# Patient Record
Sex: Female | Born: 1970 | Race: Black or African American | Hispanic: No | Marital: Single | Smoking: Current every day smoker
Health system: Southern US, Community
[De-identification: ages and names within clinical notes are randomized; demographics above are authoritative.]

## PROBLEM LIST (undated history)

## (undated) DIAGNOSIS — J45909 Unspecified asthma, uncomplicated: Secondary | ICD-10-CM

## (undated) DIAGNOSIS — D219 Benign neoplasm of connective and other soft tissue, unspecified: Secondary | ICD-10-CM

---

## 2001-12-29 HISTORY — PX: TUBAL LIGATION: SHX77

## 2004-09-14 ENCOUNTER — Emergency Department (HOSPITAL_COMMUNITY): Admission: EM | Admit: 2004-09-14 | Discharge: 2004-09-14 | Payer: Self-pay | Admitting: Emergency Medicine

## 2005-07-05 ENCOUNTER — Emergency Department (HOSPITAL_COMMUNITY): Admission: EM | Admit: 2005-07-05 | Discharge: 2005-07-05 | Payer: Self-pay | Admitting: Family Medicine

## 2005-07-24 ENCOUNTER — Ambulatory Visit: Payer: Self-pay | Admitting: Nurse Practitioner

## 2005-07-30 ENCOUNTER — Ambulatory Visit: Payer: Self-pay | Admitting: Nurse Practitioner

## 2005-11-12 ENCOUNTER — Ambulatory Visit: Payer: Self-pay | Admitting: Family Medicine

## 2006-03-02 ENCOUNTER — Emergency Department (HOSPITAL_COMMUNITY): Admission: EM | Admit: 2006-03-02 | Discharge: 2006-03-02 | Payer: Self-pay | Admitting: Family Medicine

## 2006-04-22 ENCOUNTER — Ambulatory Visit: Payer: Self-pay | Admitting: Nurse Practitioner

## 2006-04-22 ENCOUNTER — Other Ambulatory Visit: Admission: RE | Admit: 2006-04-22 | Discharge: 2006-04-22 | Payer: Self-pay | Admitting: Family Medicine

## 2006-06-12 ENCOUNTER — Ambulatory Visit: Payer: Self-pay | Admitting: Nurse Practitioner

## 2006-09-26 ENCOUNTER — Emergency Department (HOSPITAL_COMMUNITY): Admission: EM | Admit: 2006-09-26 | Discharge: 2006-09-26 | Payer: Self-pay | Admitting: Family Medicine

## 2007-04-11 ENCOUNTER — Emergency Department (HOSPITAL_COMMUNITY): Admission: EM | Admit: 2007-04-11 | Discharge: 2007-04-11 | Payer: Self-pay | Admitting: Emergency Medicine

## 2007-06-21 ENCOUNTER — Ambulatory Visit: Payer: Self-pay | Admitting: Family Medicine

## 2007-09-16 ENCOUNTER — Ambulatory Visit: Payer: Self-pay | Admitting: Internal Medicine

## 2007-10-29 ENCOUNTER — Ambulatory Visit: Payer: Self-pay | Admitting: Family Medicine

## 2008-01-07 ENCOUNTER — Ambulatory Visit: Payer: Self-pay | Admitting: Internal Medicine

## 2008-01-13 ENCOUNTER — Emergency Department (HOSPITAL_COMMUNITY): Admission: EM | Admit: 2008-01-13 | Discharge: 2008-01-13 | Payer: Self-pay | Admitting: Family Medicine

## 2008-08-08 ENCOUNTER — Emergency Department (HOSPITAL_COMMUNITY): Admission: EM | Admit: 2008-08-08 | Discharge: 2008-08-08 | Payer: Self-pay | Admitting: Family Medicine

## 2009-01-24 ENCOUNTER — Ambulatory Visit: Payer: Self-pay | Admitting: Internal Medicine

## 2009-05-20 ENCOUNTER — Emergency Department (HOSPITAL_COMMUNITY): Admission: EM | Admit: 2009-05-20 | Discharge: 2009-05-20 | Payer: Self-pay | Admitting: Emergency Medicine

## 2010-01-07 ENCOUNTER — Encounter (INDEPENDENT_AMBULATORY_CARE_PROVIDER_SITE_OTHER): Payer: Self-pay | Admitting: Family Medicine

## 2010-01-07 ENCOUNTER — Ambulatory Visit: Payer: Self-pay | Admitting: Internal Medicine

## 2010-01-07 LAB — CONVERTED CEMR LAB
ALT: 12 units/L (ref 0–35)
AST: 15 units/L (ref 0–37)
Alkaline Phosphatase: 42 units/L (ref 39–117)
BUN: 11 mg/dL (ref 6–23)
Calcium: 9.4 mg/dL (ref 8.4–10.5)
Chloride: 105 meq/L (ref 96–112)
Creatinine, Ser: 0.72 mg/dL (ref 0.40–1.20)
Saturation Ratios: 19 % — ABNORMAL LOW (ref 20–55)
TIBC: 319 ug/dL (ref 250–470)
Total Bilirubin: 0.2 mg/dL — ABNORMAL LOW (ref 0.3–1.2)
UIBC: 258 ug/dL

## 2010-01-08 ENCOUNTER — Encounter (INDEPENDENT_AMBULATORY_CARE_PROVIDER_SITE_OTHER): Payer: Self-pay | Admitting: Family Medicine

## 2010-01-08 LAB — CONVERTED CEMR LAB
Eosinophils Relative: 2 % (ref 0–5)
HCT: 37.6 % (ref 36.0–46.0)
Hemoglobin: 11.6 g/dL — ABNORMAL LOW (ref 12.0–15.0)
Lymphocytes Relative: 39 % (ref 12–46)
Lymphs Abs: 3 10*3/uL (ref 0.7–4.0)
Monocytes Absolute: 0.4 10*3/uL (ref 0.1–1.0)
Monocytes Relative: 5 % (ref 3–12)
Platelets: 305 10*3/uL (ref 150–400)
RBC: 3.82 M/uL — ABNORMAL LOW (ref 3.87–5.11)
WBC: 7.8 10*3/uL (ref 4.0–10.5)

## 2010-01-24 ENCOUNTER — Ambulatory Visit (HOSPITAL_COMMUNITY): Admission: RE | Admit: 2010-01-24 | Discharge: 2010-01-24 | Payer: Self-pay | Admitting: Family Medicine

## 2010-01-24 ENCOUNTER — Encounter (INDEPENDENT_AMBULATORY_CARE_PROVIDER_SITE_OTHER): Payer: Self-pay | Admitting: Family Medicine

## 2011-05-19 ENCOUNTER — Encounter: Payer: Self-pay | Admitting: Obstetrics & Gynecology

## 2011-09-18 LAB — I-STAT 8, (EC8 V) (CONVERTED LAB)
BUN: 12
Chloride: 107
Glucose, Bld: 85
Hemoglobin: 14.3
Potassium: 4
Sodium: 136
pH, Ven: 7.41 — ABNORMAL HIGH

## 2011-09-18 LAB — DIFFERENTIAL
Eosinophils Absolute: 0.1
Lymphocytes Relative: 14
Lymphs Abs: 2.1
Monocytes Relative: 5
Neutro Abs: 12 — ABNORMAL HIGH
Neutrophils Relative %: 80 — ABNORMAL HIGH

## 2011-09-18 LAB — POCT URINALYSIS DIP (DEVICE)
Bilirubin Urine: NEGATIVE
Glucose, UA: NEGATIVE
Nitrite: NEGATIVE
Operator id: 126491

## 2011-09-18 LAB — POCT I-STAT CREATININE: Operator id: 126491

## 2011-09-18 LAB — CBC
HCT: 37
Hemoglobin: 12.6
MCV: 90.6
Platelets: 321
RDW: 13.3

## 2012-09-14 ENCOUNTER — Encounter (HOSPITAL_COMMUNITY): Payer: Self-pay | Admitting: *Deleted

## 2012-09-14 ENCOUNTER — Emergency Department (HOSPITAL_COMMUNITY)
Admission: EM | Admit: 2012-09-14 | Discharge: 2012-09-14 | Disposition: A | Discharge status: Home / Self Care | Payer: Medicaid Other | Source: Home / Self Care | Attending: Family Medicine | Admitting: Family Medicine

## 2012-09-14 DIAGNOSIS — M1711 Unilateral primary osteoarthritis, right knee: Secondary | ICD-10-CM

## 2012-09-14 DIAGNOSIS — N946 Dysmenorrhea, unspecified: Secondary | ICD-10-CM

## 2012-09-14 DIAGNOSIS — N945 Secondary dysmenorrhea: Secondary | ICD-10-CM

## 2012-09-14 HISTORY — DX: Unspecified asthma, uncomplicated: J45.909

## 2012-09-14 HISTORY — DX: Benign neoplasm of connective and other soft tissue, unspecified: D21.9

## 2012-09-14 MED ORDER — KETOROLAC TROMETHAMINE 30 MG/ML IJ SOLN
30.0000 mg | Freq: Once | INTRAMUSCULAR | Status: AC
  Administered 2012-09-14: 30 mg via INTRAMUSCULAR

## 2012-09-14 MED ORDER — KETOROLAC TROMETHAMINE 60 MG/2ML IM SOLN
INTRAMUSCULAR | Status: AC
  Filled 2012-09-14: qty 2

## 2012-09-14 MED ORDER — DICLOFENAC POTASSIUM 50 MG PO TABS: 50.0000 mg | ORAL_TABLET | Freq: Three times a day (TID) | ORAL | Status: DC
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
## 2012-09-14 NOTE — ED Provider Notes (Signed)
History     CSN: [id number]  Arrival date & time 09/14/12  1242   First MD Initiated Contact with Patient 09/14/12 1247      Chief Complaint  Patient presents with  . Abdominal Pain    (Consider location/radiation/quality/duration/timing/severity/associated sxs/prior treatment) Patient is a 41 y.o. female presenting with female genitourinary complaint. The history is provided by the patient.  Female GU Problem Primary symptoms include pelvic pain and vaginal bleeding.  Primary symptoms include no dysuria. There has been no fever. This is a new problem. The current episode started yesterday. The problem has not changed since onset.The symptoms occur during menstruation (2 yr h/o fibroids, cramps and heavy menses.). She is not pregnant. She has not missed her period. The patient's menstrual history has been regular. The discharge was bloody. Pertinent negatives include no frequency.    Past Medical History  Diagnosis Date  . Asthma   . Fibroid     History reviewed. No pertinent past surgical history.  No family history on file.  History  Substance Use Topics  . Smoking status: Current Every Day Smoker  . Smokeless tobacco: Not on file  . Alcohol Use: No    OB History    Grav Para Term Preterm Abortions TAB SAB Ect Mult Living                  Review of Systems  Constitutional: Negative.   Genitourinary: Positive for vaginal bleeding, menstrual problem and pelvic pain. Negative for dysuria, frequency, vaginal discharge and difficulty urinating.    Allergies  Review of patient's allergies indicates no known allergies.  Home Medications   Current Outpatient Rx  Name Route Sig Dispense Refill  . ALBUTEROL SULFATE HFA 108 (90 BASE) MCG/ACT IN AERS Inhalation Inhale 2 puffs into the lungs every 6 (six) hours as needed.    Marland Kitchen DICLOFENAC POTASSIUM 50 MG PO TABS Oral Take 1 tablet (50 mg total) by mouth 3 (three) times daily. 30 tablet 0    BP 129/88  Pulse 55  Temp  98 F (36.7 C) (Oral)  Resp 17  SpO2 100%  LMP 09/14/2012  Physical Exam  Nursing note and vitals reviewed. Abdominal: Soft. Bowel sounds are normal. She exhibits no distension and no mass. There is tenderness in the suprapubic area. There is no rebound and no guarding.    Musculoskeletal: She exhibits no tenderness.       Right knee: She exhibits normal range of motion, no swelling, no effusion and no deformity. no tenderness found.    ED Course  Procedures (including critical care time)  Labs Reviewed - No data to display No results found.   1. Secondary dysmenorrhea   2. Degenerative joint disease of knee, right       MDM          Linna Hoff, MD 09/14/12 1404

## 2013-05-12 ENCOUNTER — Other Ambulatory Visit: Payer: Self-pay

## 2013-05-12 DIAGNOSIS — Z1231 Encounter for screening mammogram for malignant neoplasm of breast: Secondary | ICD-10-CM

## 2013-05-25 ENCOUNTER — Encounter: Payer: Self-pay | Admitting: Obstetrics

## 2013-06-14 ENCOUNTER — Ambulatory Visit
Admission: RE | Admit: 2013-06-14 | Discharge: 2013-06-14 | Disposition: A | Discharge status: Home / Self Care | Payer: Medicaid Other | Source: Ambulatory Visit

## 2013-06-14 DIAGNOSIS — Z1231 Encounter for screening mammogram for malignant neoplasm of breast: Secondary | ICD-10-CM

## 2013-07-19 ENCOUNTER — Ambulatory Visit: Payer: Self-pay | Admitting: Obstetrics

## 2014-11-12 ENCOUNTER — Encounter (HOSPITAL_COMMUNITY): Payer: Self-pay | Admitting: *Deleted

## 2014-11-12 ENCOUNTER — Emergency Department (INDEPENDENT_AMBULATORY_CARE_PROVIDER_SITE_OTHER)
Admission: EM | Admit: 2014-11-12 | Discharge: 2014-11-12 | Disposition: A | Discharge status: Home / Self Care | Payer: Self-pay | Source: Home / Self Care | Attending: Emergency Medicine | Admitting: Emergency Medicine

## 2014-11-12 DIAGNOSIS — W57XXXA Bitten or stung by nonvenomous insect and other nonvenomous arthropods, initial encounter: Secondary | ICD-10-CM

## 2014-11-12 DIAGNOSIS — T148 Other injury of unspecified body region: Secondary | ICD-10-CM

## 2014-11-12 MED ORDER — MUPIROCIN CALCIUM 2 % EX CREA: 1.0000 "application " | TOPICAL_CREAM | Freq: Two times a day (BID) | CUTANEOUS | Status: DC

## 2014-11-12 NOTE — ED Provider Notes (Signed)
CSN: [id number]     Arrival date & time 11/12/14  1058 History   First MD Initiated Contact with Patient 11/12/14 1101     Chief Complaint  Patient presents with  . Insect Bite   (Consider location/radiation/quality/duration/timing/severity/associated sxs/prior Treatment) HPI  She is a 43 year old woman here for evaluation of bug bites. She states yesterday morning she woke up with 3 bites on her left upper arm. They are very itchy. She did not see the insect that bit her. She has been applying Neosporin without improvement.  Past Medical History  Diagnosis Date  . Asthma   . Fibroid    Past Surgical History  Procedure Laterality Date  . Tubal ligation  2003   Family History  Problem Relation Age of Onset  . Cancer Father     lung  . Crohn's disease Father    History  Substance Use Topics  . Smoking status: Current Every Day Smoker -- 0.50 packs/day    Types: Cigarettes  . Smokeless tobacco: Not on file  . Alcohol Use: No   OB History    No data available     Review of Systems  Skin: Positive for rash.    Allergies  Amoxicillin  Home Medications   Prior to Admission medications   Medication Sig Start Date End Date Taking? Authorizing Provider  albuterol (PROVENTIL HFA;VENTOLIN HFA) 108 (90 BASE) MCG/ACT inhaler Inhale 2 puffs into the lungs every 6 (six) hours as needed.   Yes Historical Provider, MD  diclofenac (CATAFLAM) 50 MG tablet Take 1 tablet (50 mg total) by mouth 3 (three) times daily. 09/14/12   Billy Fischer, MD  mupirocin cream (BACTROBAN) 2 % Apply 1 application topically 2 (two) times daily. 11/12/14   Melony Overly, MD   BP 118/79 mmHg  Pulse 60  Temp(Src) 98.3 F (36.8 C) (Oral)  Resp 16  SpO2 100%  LMP 11/06/2014 Physical Exam  Constitutional: She is oriented to person, place, and time. She appears well-developed and well-nourished. No distress.  Cardiovascular: Normal rate.   Pulmonary/Chest: Effort normal.  Neurological: She is alert  and oriented to person, place, and time.  Skin:  3 erythematous wheals on left upper arm. Each one is 3 cm in diameter.    ED Course  Procedures (including critical care time) Labs Review Labs Reviewed - No data to display  Imaging Review No results found.   MDM   1. Bug bites    Unknown insect bite. They do not appear to be infected. We'll treat symptomatically with over-the-counter Benadryl and hydrocortisone creams. Okay to take oral Benadryl as well for itching. We'll have her apply mupirocin cream twice a day for 2 days. Follow-up as needed.    Melony Overly, MD 11/12/14 1150

## 2014-11-12 NOTE — ED Notes (Signed)
Insect bites to L upper arm.  Did not see any spiders in her bedroom.  Woke up yesterday AM with 3 bites to L upper arm with itching.

## 2014-11-12 NOTE — Discharge Instructions (Signed)
You have some sort of bug bite on your arm. Apply the Bactroban cream twice a day for 2 days. Use over the counter benadryl cream or hydrocortisone cream to help with the itching and redness. You can take oral benadryl as needed to help with itching as well.  Follow up as needed.

## 2016-07-14 ENCOUNTER — Ambulatory Visit (HOSPITAL_COMMUNITY)
Admission: EM | Admit: 2016-07-14 | Discharge: 2016-07-14 | Disposition: A | Discharge status: Home / Self Care | Payer: Self-pay | Attending: Family Medicine | Admitting: Family Medicine

## 2016-07-14 ENCOUNTER — Encounter (HOSPITAL_COMMUNITY): Payer: Self-pay | Admitting: Emergency Medicine

## 2016-07-14 DIAGNOSIS — M722 Plantar fascial fibromatosis: Secondary | ICD-10-CM

## 2016-07-14 NOTE — ED Provider Notes (Signed)
CSN: [id number]     Arrival date & time 07/14/16  1238 History   First MD Initiated Contact with Patient 07/14/16 1434     Chief Complaint  Patient presents with  . Foot Pain   (Consider location/radiation/quality/duration/timing/severity/associated sxs/prior Treatment) HPI Comments: Procedure 45-year-old female developed pain to the left heel 3 days ago. She denies any trauma. She did not twist her ankle, fall or injure her ankle or foot in any way.  her job requires prolonged standing for several hours during the day. Weightbearing and putting weight on on her foot increases the pain to the plantar aspect of the heel. Pain is worse with first foot plantar of the day after awakening.   Patient is a 45 y.o. female presenting with lower extremity pain.  Foot Pain    Past Medical History  Diagnosis Date  . Asthma   . Fibroid    Past Surgical History  Procedure Laterality Date  . Tubal ligation  2003   Family History  Problem Relation Age of Onset  . Cancer Father     lung  . Crohn's disease Father    Social History  Substance Use Topics  . Smoking status: Current Every Day Smoker -- 0.50 packs/day    Types: Cigarettes  . Smokeless tobacco: None  . Alcohol Use: No   OB History    No data available     Review of Systems  Constitutional: Negative.  Negative for fever, chills and activity change.  HENT: Negative.   Respiratory: Negative.   Musculoskeletal:       As per HPI  Skin: Negative for color change, pallor and rash.  Neurological: Negative.     Allergies  Amoxicillin  Home Medications   Prior to Admission medications   Medication Sig Start Date End Date Taking? Authorizing Provider  albuterol (PROVENTIL HFA;VENTOLIN HFA) 108 (90 BASE) MCG/ACT inhaler Inhale 2 puffs into the lungs every 6 (six) hours as needed.    Historical Provider, MD  diclofenac (CATAFLAM) 50 MG tablet Take 1 tablet (50 mg total) by mouth 3 (three) times daily. 09/14/12   Billy Fischer, MD   mupirocin cream (BACTROBAN) 2 % Apply 1 application topically 2 (two) times daily. 11/12/14   Melony Overly, MD   Meds Ordered and Administered this Visit  Medications - No data to display  BP 126/75 mmHg  Pulse 60  Temp(Src) 98.3 F (36.8 C) (Oral)  Resp 12  SpO2 100%  LMP 06/30/2016 No data found.   Physical Exam  Constitutional: She appears well-developed and well-nourished.  HENT:  Head: Normocephalic and atraumatic.  Eyes: Conjunctivae and EOM are normal.  Neck: Normal range of motion.  Cardiovascular: Normal rate.   Pulmonary/Chest: Effort normal.  Musculoskeletal:  Foot without discoloration, deformity, swelling or apparent injury. No bony tenderness. There is marked tenderness to the plantar aspect of the heel. No injury to the Skin. No evidence of infection. No swelling or erythema or cellulitis.  Neurological: She is alert.  Skin: Skin is warm and dry.  Nursing note and vitals reviewed.   ED Course  Procedures (including critical care time)  Labs Review Labs Reviewed - No data to display  Imaging Review No results found.   Visual Acuity Review  Right Eye Distance:   Left Eye Distance:   Bilateral Distance:    Right Eye Near:   Left Eye Near:    Bilateral Near:         MDM   1. Plantar  fasciitis of left foot    Roll your heel over a frozen can several times a day for inflammation. Obtained full-length inserts with high arch support to place in your shoes. If you are not gradually getting better with these measures he may need to have injections into your heel or follow-up with a podiatrist.     Janne Napoleon, NP 07/14/16 1553

## 2016-07-14 NOTE — Discharge Instructions (Signed)
Plantar Fasciitis Roll your heel over a frozen can several times a day for inflammation. Obtained full-length inserts with high arch support to place in your shoes. If you are not gradually getting better with these measures he may need to have injections into your heel or follow-up with a podiatrist. Plantar fasciitis is a painful foot condition that affects the heel. It occurs when the band of tissue that connects the toes to the heel bone (plantar fascia) becomes irritated. This can happen after exercising too much or doing other repetitive activities (overuse injury). The pain from plantar fasciitis can range from mild irritation to severe pain that makes it difficult for you to walk or move. The pain is usually worse in the morning or after you have been sitting or lying down for a while. CAUSES This condition may be caused by:  Standing for long periods of time.  Wearing shoes that do not fit.  Doing high-impact activities, including running, aerobics, and ballet.  Being overweight.  Having an abnormal way of walking (gait).  Having tight calf muscles.  Having high arches in your feet.  Starting a new athletic activity. SYMPTOMS The main symptom of this condition is heel pain. Other symptoms include:  Pain that gets worse after activity or exercise.  Pain that is worse in the morning or after resting.  Pain that goes away after you walk for a few minutes. DIAGNOSIS This condition may be diagnosed based on your signs and symptoms. Your health care provider will also do a physical exam to check for:  A tender area on the bottom of your foot.  A high arch in your foot.  Pain when you move your foot.  Difficulty moving your foot. You may also need to have imaging studies to confirm the diagnosis. These can include:  X-rays.  Ultrasound.  MRI. TREATMENT  Treatment for plantar fasciitis depends on the severity of the condition. Your treatment may include:  Rest,  ice, and over-the-counter pain medicines to manage your pain.  Exercises to stretch your calves and your plantar fascia.  A splint that holds your foot in a stretched, upward position while you sleep (night splint).  Physical therapy to relieve symptoms and prevent problems in the future.  Cortisone injections to relieve severe pain.  Extracorporeal shock wave therapy (ESWT) to stimulate damaged plantar fascia with electrical impulses. It is often used as a last resort before surgery.  Surgery, if other treatments have not worked after 12 months. HOME CARE INSTRUCTIONS  Take medicines only as directed by your health care provider.  Avoid activities that cause pain.  Roll the bottom of your foot over a bag of ice or a bottle of cold water. Do this for 20 minutes, 3-4 times a day.  Perform simple stretches as directed by your health care provider.  Try wearing athletic shoes with air-sole or gel-sole cushions or soft shoe inserts.  Wear a night splint while sleeping, if directed by your health care provider.  Keep all follow-up appointments with your health care provider. PREVENTION   Do not perform exercises or activities that cause heel pain.  Consider finding low-impact activities if you continue to have problems.  Lose weight if you need to. The best way to prevent plantar fasciitis is to avoid the activities that aggravate your plantar fascia. SEEK MEDICAL CARE IF:  Your symptoms do not go away after treatment with home care measures.  Your pain gets worse.  Your pain affects your ability to  move or do your daily activities.   This information is not intended to replace advice given to you by your health care provider. Make sure you discuss any questions you have with your health care provider.   Document Released: 09/09/2001 Document Revised: 09/05/2015 Document Reviewed: 10/25/2014 Elsevier Interactive Patient Education [employer].

## 2016-07-14 NOTE — ED Notes (Signed)
Pt c/o left heel pain onset x3 days that radiates to ankle Reports pain increases w/long periods of walking/standing Steady gait.... A&O x4... NAD

## 2016-08-24 ENCOUNTER — Emergency Department (HOSPITAL_COMMUNITY)
Admission: EM | Admit: 2016-08-24 | Discharge: 2016-08-24 | Disposition: A | Discharge status: Home / Self Care | Payer: Medicaid Other | Attending: Emergency Medicine | Admitting: Emergency Medicine

## 2016-08-24 ENCOUNTER — Emergency Department (HOSPITAL_COMMUNITY): Payer: Medicaid Other

## 2016-08-24 ENCOUNTER — Encounter (HOSPITAL_COMMUNITY): Payer: Self-pay | Admitting: Emergency Medicine

## 2016-08-24 ENCOUNTER — Ambulatory Visit (HOSPITAL_COMMUNITY)
Admission: EM | Admit: 2016-08-24 | Discharge: 2016-08-24 | Disposition: A | Discharge status: Nursing Home (Includes ICF) | Payer: Medicaid Other | Attending: Nurse Practitioner | Admitting: Nurse Practitioner

## 2016-08-24 DIAGNOSIS — J45909 Unspecified asthma, uncomplicated: Secondary | ICD-10-CM | POA: Insufficient documentation

## 2016-08-24 DIAGNOSIS — M542 Cervicalgia: Secondary | ICD-10-CM

## 2016-08-24 DIAGNOSIS — Y92410 Unspecified street and highway as the place of occurrence of the external cause: Secondary | ICD-10-CM | POA: Insufficient documentation

## 2016-08-24 DIAGNOSIS — Y999 Unspecified external cause status: Secondary | ICD-10-CM | POA: Diagnosis not present

## 2016-08-24 DIAGNOSIS — R519 Headache, unspecified: Secondary | ICD-10-CM

## 2016-08-24 DIAGNOSIS — R51 Headache: Secondary | ICD-10-CM | POA: Insufficient documentation

## 2016-08-24 DIAGNOSIS — M546 Pain in thoracic spine: Secondary | ICD-10-CM | POA: Insufficient documentation

## 2016-08-24 DIAGNOSIS — Y939 Activity, unspecified: Secondary | ICD-10-CM | POA: Insufficient documentation

## 2016-08-24 DIAGNOSIS — F17210 Nicotine dependence, cigarettes, uncomplicated: Secondary | ICD-10-CM | POA: Diagnosis not present

## 2016-08-24 MED ORDER — METHOCARBAMOL 500 MG PO TABS: 500.0000 mg | ORAL_TABLET | Freq: Two times a day (BID) | ORAL | 0 refills | Status: DC | PRN

## 2016-08-24 MED ORDER — DIAZEPAM 2 MG PO TABS
2.0000 mg | ORAL_TABLET | Freq: Once | ORAL | Status: AC
  Administered 2016-08-24: 2 mg via ORAL
  Filled 2016-08-24: qty 1

## 2016-08-24 MED ORDER — OXYCODONE-ACETAMINOPHEN 5-325 MG PO TABS
ORAL_TABLET | ORAL | Status: AC
  Administered 2016-08-24: 1 via ORAL
  Filled 2016-08-24: qty 1

## 2016-08-24 MED ORDER — OXYCODONE-ACETAMINOPHEN 5-325 MG PO TABS
1.0000 | ORAL_TABLET | ORAL | Status: AC | PRN
  Administered 2016-08-24 (×2): 1 via ORAL
  Filled 2016-08-24: qty 1

## 2016-08-24 MED ORDER — IBUPROFEN 800 MG PO TABS
800.0000 mg | ORAL_TABLET | Freq: Once | ORAL | Status: AC
  Administered 2016-08-24: 800 mg via ORAL
  Filled 2016-08-24: qty 1

## 2016-08-24 MED ORDER — NAPROXEN 250 MG PO TABS: 250.0000 mg | ORAL_TABLET | Freq: Two times a day (BID) | ORAL | 0 refills | Status: DC
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
## 2016-08-24 NOTE — ED Notes (Signed)
Reports no headache at this time.
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
## 2016-08-24 NOTE — ED Notes (Signed)
Patient report called to First RN Angela Nevin at [facility] ED.

## 2017-01-29 ENCOUNTER — Encounter (HOSPITAL_COMMUNITY): Payer: Self-pay | Admitting: Emergency Medicine

## 2017-01-29 ENCOUNTER — Ambulatory Visit (HOSPITAL_COMMUNITY)
Admission: EM | Admit: 2017-01-29 | Discharge: 2017-01-29 | Disposition: A | Discharge status: Home / Self Care | Payer: Medicaid Other | Attending: Family Medicine | Admitting: Family Medicine

## 2017-01-29 DIAGNOSIS — L0231 Cutaneous abscess of buttock: Secondary | ICD-10-CM | POA: Diagnosis not present

## 2017-01-29 MED ORDER — HYDROCODONE-ACETAMINOPHEN 5-325 MG PO TABS: 1.0000 | ORAL_TABLET | ORAL | 0 refills | Status: DC | PRN

## 2017-01-29 MED ORDER — SULFAMETHOXAZOLE-TRIMETHOPRIM 800-160 MG PO TABS: 1.0000 | ORAL_TABLET | Freq: Two times a day (BID) | ORAL | 0 refills | Status: AC

## 2017-01-29 NOTE — ED Triage Notes (Signed)
Here for poss pilonidal cyst onset 2 week  Unable to sit or walk due to pain  Denies fevers... A&O x4.. NAD

## 2017-01-29 NOTE — ED Provider Notes (Signed)
CSN: [id number]     Arrival date & time 01/29/17  1740 History   None    Chief Complaint  Patient presents with  . Abscess   (Consider location/radiation/quality/duration/timing/severity/associated sxs/prior Treatment) Patient has cyst on left upper gluteal cleft and it is painful.  She has had this wound for over 2 weeks.   The history is provided by the patient.  Abscess  Abscess location: left buttock. Abscess quality: fluctuance, painful and redness   Red streaking: no   Duration:  2 weeks Progression:  Worsening Pain details:    Quality:  Pressure and aching   Severity:  Moderate   Duration:  2 weeks   Timing:  Constant   Progression:  Worsening Chronicity:  New Relieved by:  Nothing Worsened by:  Nothing Ineffective treatments:  None tried   Past Medical History:  Diagnosis Date  . Asthma   . Fibroid    Past Surgical History:  Procedure Laterality Date  . TUBAL LIGATION  2003   Family History  Problem Relation Age of Onset  . Cancer Father     lung  . Crohn's disease Father    Social History  Substance Use Topics  . Smoking status: Current Every Day Smoker    Packs/day: 0.50    Years: 10.00    Types: Cigarettes  . Smokeless tobacco: Never Used  . Alcohol use No   OB History    No data available     Review of Systems  Constitutional: Negative.   HENT: Negative.   Eyes: Negative.   Respiratory: Negative.   Cardiovascular: Negative.   Gastrointestinal: Negative.   Endocrine: Negative.   Genitourinary: Negative.   Musculoskeletal: Negative.   Skin: Positive for wound.  Allergic/Immunologic: Negative.   Neurological: Negative.   Hematological: Negative.   Psychiatric/Behavioral: Negative.     Allergies  Amoxicillin  Home Medications   Prior to Admission medications   Medication Sig Start Date End Date Taking? Authorizing Provider  albuterol (PROVENTIL HFA;VENTOLIN HFA) 108 (90 BASE) MCG/ACT inhaler Inhale 2 puffs into the lungs every 6  (six) hours as needed.    Historical Provider, MD  diclofenac (CATAFLAM) 50 MG tablet Take 1 tablet (50 mg total) by mouth 3 (three) times daily. 09/14/12   Billy Fischer, MD  HYDROcodone-acetaminophen (NORCO/VICODIN) 5-325 MG tablet Take 1-2 tablets by mouth every 4 (four) hours as needed. 01/29/17   Lysbeth Penner, FNP  methocarbamol (ROBAXIN) 500 MG tablet Take 1 tablet (500 mg total) by mouth 2 (two) times daily as needed for muscle spasms. 08/24/16   Waynetta Pean, PA-C  mupirocin cream (BACTROBAN) 2 % Apply 1 application topically 2 (two) times daily. 11/12/14   Melony Overly, MD  naproxen (NAPROSYN) 250 MG tablet Take 1 tablet (250 mg total) by mouth 2 (two) times daily with a meal. 08/24/16   Waynetta Pean, PA-C  sulfamethoxazole-trimethoprim (BACTRIM DS,SEPTRA DS) 800-160 MG tablet Take 1 tablet by mouth 2 (two) times daily. 01/29/17 02/05/17  Lysbeth Penner, FNP   Meds Ordered and Administered this Visit  Medications - No data to display  BP 114/77 (BP Location: Left Arm)   Pulse 76   Temp 98.3 F (36.8 C) (Oral)   Resp 20   LMP 01/17/2017   SpO2 100%  No data found.   Physical Exam  Constitutional: She appears well-developed and well-nourished.  HENT:  Head: Normocephalic and atraumatic.  Eyes: Conjunctivae and EOM are normal. Pupils are equal, round, and reactive to light.  Neck: Normal range of motion. Neck supple.  Cardiovascular: Normal rate, regular rhythm and normal heart sounds.   Pulmonary/Chest: Effort normal and breath sounds normal.  Skin:  Abscess left upper gluteal fold   Nursing note and vitals reviewed.   Urgent Care Course     .Marland KitchenIncision and Drainage Date/Time: 01/29/2017 7:01 PM Performed by: Lysbeth Penner Authorized by: Robyn Haber   Consent:    Consent obtained:  Verbal   Consent given by:  Patient   Risks discussed:  Bleeding   Alternatives discussed:  No treatment Location:    Type:  Abscess   Size:  3 cm   Location: left upper  gluteal cleft. Pre-procedure details:    Skin preparation:  Betadine Anesthesia (see MAR for exact dosages):    Anesthesia method:  Local infiltration   Local anesthetic:  Lidocaine 1% w/o epi Procedure type:    Complexity:  Simple Procedure details:    Needle aspiration: no     Incision types:  Stab incision   Incision depth:  Dermal   Scalpel blade:  11   Wound management:  Probed and deloculated   Drainage:  Purulent, serosanguinous and bloody   Drainage amount:  Moderate   Wound treatment:  Wound left open   Packing materials:  1/2 in iodoform gauze   Amount 1/2" iodoform:  4 cm Post-procedure details:    Patient tolerance of procedure:  Tolerated well, no immediate complications   (including critical care time)  Labs Review Labs Reviewed  AEROBIC/ANAEROBIC CULTURE (SURGICAL/DEEP WOUND)    Imaging Review No results found.   Visual Acuity Review  Right Eye Distance:   Left Eye Distance:   Bilateral Distance:    Right Eye Near:   Left Eye Near:    Bilateral Near:         MDM   1. Abscess of left buttock    Incision and Drainage  Bactrim DS one po bid x 10 days #20 Norco 5/325 one to two po q 6 hours prn pain #6  Advised to remove packing out in 2 days.  May follow up here on 2 days for wound check.      Lysbeth Penner, FNP 01/29/17 [phone]

## 2017-01-29 NOTE — ED Notes (Signed)
At bedside as chaperone, specimen was obtained by provider.

## 2017-02-02 ENCOUNTER — Ambulatory Visit (HOSPITAL_COMMUNITY)
Admission: EM | Admit: 2017-02-02 | Discharge: 2017-02-02 | Disposition: A | Discharge status: Home / Self Care | Payer: Self-pay | Attending: Internal Medicine | Admitting: Internal Medicine

## 2017-02-02 DIAGNOSIS — L0291 Cutaneous abscess, unspecified: Secondary | ICD-10-CM

## 2017-02-02 NOTE — ED Notes (Signed)
At bedside for bill oxford, np (provider) examination of wound on buttocks.  Packing fell out while going to bathroom this morning.  Site unremarkable

## 2017-02-02 NOTE — Discharge Instructions (Signed)
Keep band aid over wound.  Wound no longer needs packing. Continue antibiotics and follow up as necessary.

## 2017-02-02 NOTE — ED Provider Notes (Signed)
CSN: [id number]     Arrival date & time 02/02/17  1145 History   First MD Initiated Contact with Patient 02/02/17 1420     Chief Complaint  Patient presents with  . Abscess  . packaging removal    (Consider location/radiation/quality/duration/timing/severity/associated sxs/prior Treatment) Patient is here for follow up on abscess left upper buttock.  She is not sure if the packing is still there but she is feeling a lot better.   The history is provided by the patient.  Abscess  Abscess location: buttock. Abscess quality: draining   Red streaking: no   Duration:  4 days Progression:  Improving Chronicity:  New Relieved by:  Oral antibiotics and draining/squeezing Worsened by:  Nothing Ineffective treatments:  Oral antibiotics and draining/squeezing   Past Medical History:  Diagnosis Date  . Asthma   . Fibroid    Past Surgical History:  Procedure Laterality Date  . TUBAL LIGATION  2003   Family History  Problem Relation Age of Onset  . Cancer Father     lung  . Crohn's disease Father    Social History  Substance Use Topics  . Smoking status: Current Every Day Smoker    Packs/day: 0.50    Years: 10.00    Types: Cigarettes  . Smokeless tobacco: Never Used  . Alcohol use No   OB History    No data available     Review of Systems  Constitutional: Negative.   HENT: Negative.   Eyes: Negative.   Respiratory: Negative.   Cardiovascular: Negative.   Gastrointestinal: Negative.   Endocrine: Negative.   Genitourinary: Negative.   Musculoskeletal: Negative.   Allergic/Immunologic: Negative.   Hematological: Negative.   Psychiatric/Behavioral: Negative.     Allergies  Amoxicillin  Home Medications   Prior to Admission medications   Medication Sig Start Date End Date Taking? Authorizing Provider  albuterol (PROVENTIL HFA;VENTOLIN HFA) 108 (90 BASE) MCG/ACT inhaler Inhale 2 puffs into the lungs every 6 (six) hours as needed.   Yes Historical Provider, MD   diclofenac (CATAFLAM) 50 MG tablet Take 1 tablet (50 mg total) by mouth 3 (three) times daily. 09/14/12  Yes Billy Fischer, MD  HYDROcodone-acetaminophen (NORCO/VICODIN) 5-325 MG tablet Take 1-2 tablets by mouth every 4 (four) hours as needed. 01/29/17  Yes Lysbeth Penner, FNP  methocarbamol (ROBAXIN) 500 MG tablet Take 1 tablet (500 mg total) by mouth 2 (two) times daily as needed for muscle spasms. 08/24/16  Yes Waynetta Pean, PA-C  mupirocin cream (BACTROBAN) 2 % Apply 1 application topically 2 (two) times daily. 11/12/14  Yes Melony Overly, MD  naproxen (NAPROSYN) 250 MG tablet Take 1 tablet (250 mg total) by mouth 2 (two) times daily with a meal. 08/24/16  Yes Waynetta Pean, PA-C  sulfamethoxazole-trimethoprim (BACTRIM DS,SEPTRA DS) 800-160 MG tablet Take 1 tablet by mouth 2 (two) times daily. 01/29/17 02/05/17 Yes Lysbeth Penner, FNP   Meds Ordered and Administered this Visit  Medications - No data to display  BP 100/64 (BP Location: Left Arm)   Pulse (!) 59   Temp 98.1 F (36.7 C) (Oral)   Resp 16   LMP 01/17/2017   SpO2 100%  No data found.   Physical Exam  Constitutional: She appears well-developed and well-nourished.  HENT:  Head: Normocephalic and atraumatic.  Right Ear: External ear normal.  Left Ear: External ear normal.  Mouth/Throat: Oropharynx is clear and moist.  Eyes: Conjunctivae and EOM are normal. Pupils are equal, round, and reactive to  light.  Neck: Normal range of motion. Neck supple.  Cardiovascular: Normal rate, regular rhythm and normal heart sounds.   Pulmonary/Chest: Effort normal and breath sounds normal.  Skin:  Left upper buttock with wound with no packing and s/p incision and drainage is healing well and no induration or fluctuance appreciated.  Nursing note and vitals reviewed.   Urgent Care Course     Procedures (including critical care time)  Labs Review Labs Reviewed - No data to display  Imaging Review No results found.   Visual  Acuity Review  Right Eye Distance:   Left Eye Distance:   Bilateral Distance:    Right Eye Near:   Left Eye Near:    Bilateral Near:         MDM   1. Abscess    Wound is healing well and recommend continue abx's. Placed 2x2 dressing and taped secure.  Keep bandaid over wound.  No need for packing or repacking at this time.  Please follow up prn and return precautions explained.      Lysbeth Penner, FNP 02/02/17 1425

## 2017-02-02 NOTE — ED Triage Notes (Signed)
Here for removal of packing in abscess  States area is still sensitive

## 2017-02-03 LAB — AEROBIC/ANAEROBIC CULTURE W GRAM STAIN (SURGICAL/DEEP WOUND)

## 2017-02-03 LAB — AEROBIC/ANAEROBIC CULTURE (SURGICAL/DEEP WOUND)

## 2017-07-22 ENCOUNTER — Encounter (HOSPITAL_COMMUNITY): Payer: Self-pay | Admitting: Emergency Medicine

## 2017-07-22 ENCOUNTER — Ambulatory Visit (HOSPITAL_COMMUNITY)
Admission: EM | Admit: 2017-07-22 | Discharge: 2017-07-22 | Disposition: A | Discharge status: Home / Self Care | Payer: Medicaid Other | Attending: Family Medicine | Admitting: Family Medicine

## 2017-07-22 DIAGNOSIS — N946 Dysmenorrhea, unspecified: Secondary | ICD-10-CM

## 2017-07-22 DIAGNOSIS — R1030 Lower abdominal pain, unspecified: Secondary | ICD-10-CM

## 2017-07-22 DIAGNOSIS — M545 Low back pain: Secondary | ICD-10-CM | POA: Diagnosis not present

## 2017-07-22 MED ORDER — MELOXICAM 7.5 MG PO TABS: 7.5000 mg | ORAL_TABLET | Freq: Every day | ORAL | 0 refills | Status: AC

## 2017-07-22 MED ORDER — KETOROLAC TROMETHAMINE 60 MG/2ML IM SOLN
60.0000 mg | Freq: Once | INTRAMUSCULAR | Status: AC
  Administered 2017-07-22: 60 mg via INTRAMUSCULAR

## 2017-07-22 MED ORDER — KETOROLAC TROMETHAMINE 60 MG/2ML IM SOLN
INTRAMUSCULAR | Status: AC
  Filled 2017-07-22: qty 2

## 2017-07-22 NOTE — ED Provider Notes (Signed)
CSN: [id number]     Arrival date & time 07/22/17  1222 History   None    Chief Complaint  Patient presents with  . Abdominal Pain  . Back Pain   (Consider location/radiation/quality/duration/timing/severity/associated sxs/prior Treatment) 46 year old female with history of ovarian cyst, fibroids, dysmenorrhea comes in for 2 day history of abdominal pain and back pain. She is currently on her cycle, pain is suprapubic. She had one episode of vomiting due to the pain. Took Advil last night, has not had anything for pain today. Denies diarrhea, constipation. Had normal bowel movement yesterday. Denies urinary symptoms such as frequency, dysuria, hematuria. Denies vaginal discharge, pain, itching. Denies fever, chills, night sweats. States she had started evaluation she years ago for her dysmenorrhea, but lost her insurance and was not able to follow-up.      Past Medical History:  Diagnosis Date  . Asthma   . Fibroid    Past Surgical History:  Procedure Laterality Date  . TUBAL LIGATION  2003   Family History  Problem Relation Age of Onset  . Cancer Father        lung  . Crohn's disease Father    Social History  Substance Use Topics  . Smoking status: Current Every Day Smoker    Packs/day: 0.50    Years: 10.00    Types: Cigarettes  . Smokeless tobacco: Never Used  . Alcohol use No   OB History    No data available     Review of Systems  Reason unable to perform ROS: See HPI as above.    Allergies  Amoxicillin  Home Medications   Prior to Admission medications   Medication Sig Start Date End Date Taking? Authorizing Provider  albuterol (PROVENTIL HFA;VENTOLIN HFA) 108 (90 BASE) MCG/ACT inhaler Inhale 2 puffs into the lungs every 6 (six) hours as needed.    [provider]  diclofenac (CATAFLAM) 50 MG tablet Take 1 tablet (50 mg total) by mouth 3 (three) times daily. 09/14/12   Billy Fischer, MD  HYDROcodone-acetaminophen (NORCO/VICODIN) 5-325 MG tablet  Take 1-2 tablets by mouth every 4 (four) hours as needed. 01/29/17   Lysbeth Penner, FNP  meloxicam (MOBIC) 7.5 MG tablet Take 1 tablet (7.5 mg total) by mouth daily. 07/22/17 08/01/17  Ok Edwards, PA-C  methocarbamol (ROBAXIN) 500 MG tablet Take 1 tablet (500 mg total) by mouth 2 (two) times daily as needed for muscle spasms. 08/24/16   Waynetta Pean, PA-C  mupirocin cream (BACTROBAN) 2 % Apply 1 application topically 2 (two) times daily. 11/12/14   Melony Overly, MD  naproxen (NAPROSYN) 250 MG tablet Take 1 tablet (250 mg total) by mouth 2 (two) times daily with a meal. 08/24/16   Waynetta Pean, PA-C   Meds Ordered and Administered this Visit   Medications  ketorolac (TORADOL) injection 60 mg (60 mg Intramuscular Given 07/22/17 1319)    BP (!) 141/81 (BP Location: Left Arm)   Pulse 72   Temp 98.4 F (36.9 C) (Oral)   Resp (!) 22   SpO2 97%  No data found.   Physical Exam  Constitutional: She is oriented to person, place, and time. She appears well-developed and well-nourished. She appears distressed (due to pain. She is tearful).  Abdominal: Soft. Bowel sounds are normal. She exhibits no mass. There is tenderness (suprapubic). There is no rebound and no guarding.  Patient in wheelchair while examination, states too painful to move.  Musculoskeletal:  Tenderness to palpation of the left  lower back, no midline tenderness. Deferred further testing due to patient's pain.  Neurological: She is alert and oriented to person, place, and time.  Skin: Skin is warm and dry.  Psychiatric: She has a normal mood and affect. Her behavior is normal. Judgment normal.    Urgent Care Course     Procedures (including critical care time)  Labs Review Labs Reviewed - No data to display  Imaging Review No results found.       MDM   1. Dysmenorrhea    Discussed with patient possible causes of abdominal pain, including endometriosis, ovarian cyst, urinary tract causes, intestinal causes.  Discussed with patient will not be able to rule out ovarian cyst due to limited testing at this office, offered transfer patient to emergency department for ultrasound. Patient declined transfer. Unable to obtain urine sample, patient will like to defer further testing. Toradol shot in office given, patient states pain without improvement. Patient deferred further evaluation, stating she would like to go home. Prescription for Mobic given for pain, patient to take after 6 hours of leaving office. Can take Tylenol for any additional pain. Patient to follow up with GYN for further evaluation. Discussed with patient will need to go to emergency department as pain worsens  For further evaluation.   Ok Edwards, PA-C 07/22/17 1359

## 2017-07-22 NOTE — ED Triage Notes (Signed)
Reports a history of cysts, patient has painful periods every month.  This episode is extremely painful, worse than usual.  Denies pain with urination, last bm was yesterday morning and states normal.  Patient currently having her period.

## 2017-07-22 NOTE — Discharge Instructions (Signed)
Take Mobic for menstrual cramps. Follow up with GYN for further evaluation of dysmenorrhea. If pain worsens, follow up at the ED for further evaluation.

## 2017-08-13 ENCOUNTER — Encounter: Payer: Self-pay | Admitting: General Practice

## 2017-08-13 ENCOUNTER — Ambulatory Visit (INDEPENDENT_AMBULATORY_CARE_PROVIDER_SITE_OTHER): Payer: Medicaid Other | Admitting: Obstetrics & Gynecology

## 2017-08-13 ENCOUNTER — Other Ambulatory Visit: Payer: Self-pay | Admitting: Obstetrics & Gynecology

## 2017-08-13 ENCOUNTER — Encounter: Payer: Self-pay | Admitting: Obstetrics & Gynecology

## 2017-08-13 ENCOUNTER — Other Ambulatory Visit (HOSPITAL_COMMUNITY)
Admission: RE | Admit: 2017-08-13 | Discharge: 2017-08-13 | Disposition: A | Discharge status: Home / Self Care | Payer: Medicaid Other | Source: Ambulatory Visit | Attending: Obstetrics & Gynecology | Admitting: Obstetrics & Gynecology

## 2017-08-13 VITALS — BP 133/93 | HR 58 | Wt 143.3 lb

## 2017-08-13 DIAGNOSIS — Z0000 Encounter for general adult medical examination without abnormal findings: Secondary | ICD-10-CM | POA: Diagnosis not present

## 2017-08-13 DIAGNOSIS — Z01419 Encounter for gynecological examination (general) (routine) without abnormal findings: Secondary | ICD-10-CM | POA: Diagnosis not present

## 2017-08-13 DIAGNOSIS — Z1231 Encounter for screening mammogram for malignant neoplasm of breast: Secondary | ICD-10-CM

## 2017-08-13 DIAGNOSIS — N946 Dysmenorrhea, unspecified: Secondary | ICD-10-CM | POA: Insufficient documentation

## 2017-08-13 DIAGNOSIS — D259 Leiomyoma of uterus, unspecified: Secondary | ICD-10-CM

## 2017-08-13 NOTE — Progress Notes (Signed)
Patient ID: Michelle Becker, female   DOB: 1971-04-27, 46 y.o.   MRN: [id number]  Chief Complaint  Patient presents with  . Gynecologic Exam   Painful menses and fibroid uterus HPI Michelle Becker is a 46 y.o. female.  [id number] Long h/o painful menses with fibroid identified 7 years ago by Michelle Becker. S/P BTL, pain not well controlled with large doses of ibuprofen HPI  Past Medical History:  Diagnosis Date  . Asthma   . Fibroid     Past Surgical History:  Procedure Laterality Date  . TUBAL LIGATION  2003    Family History  Problem Relation Age of Onset  . Cancer Father        lung  . Crohn's disease Father     Social History Social History  Substance Use Topics  . Smoking status: Current Every Day Smoker    Packs/day: 0.50    Years: 10.00    Types: Cigarettes  . Smokeless tobacco: Never Used  . Alcohol use No    Allergies  Allergen Reactions  . Amoxicillin Itching    Throat itching and scratching    Current Outpatient Prescriptions  Medication Sig Dispense Refill  . meloxicam (MOBIC) 7.5 MG tablet Take 7.5 mg by mouth daily.     No current facility-administered medications for this visit.     Review of Systems Review of Systems  Blood pressure (!) 133/93, pulse (!) 58, weight 143 lb 4.8 oz (65 kg), last menstrual period 08/09/2017.  Physical Exam Physical Exam  Data Reviewed Clinical Data: Cyclic pelvic pain.  Suspected endometriosis.  LMP 01/22/2010   TRANSABDOMINAL AND TRANSVAGINAL ULTRASOUND OF PELVIS   Technique:  Both transabdominal and transvaginal ultrasound examinations of the pelvis were performed including evaluation of the uterus, ovaries, adnexal regions, and pelvic cul-de-sac.   Comparison:  None   Findings:   Uterus demonstrates a sagittal length of 10.6 cm, an AP width of 6.1 cm and a transverse width of 6.9 cm.  There is an area of focally altered echotexture identified in the fundal region measuring 2.0 x 2.4 by 2.9 cm  compatible with the fundal fibroid. The splays the endometrial lining and would correlate with at least a partial submucosal position.  Smaller focal fibroids are identified in the posterior upper uterine segment measuring 0.8 x 1.5 x 1.0 cm and in the anterior upper uterine segment measuring 1.7 x 1.1 x 1.3 cm compatible with mural fibroids.   Endometrium the fundal portion of the is splayed precluding evaluation.  The mid and lower portion of the endometrial lining demonstrates an AP width of 5.6 mm and appears homogeneous in echotexture.  This would correlate with the patient's postsecretory phase of the cycle and given LMP of 01/22/2010   Right Ovary appears normal measuring 2.4 x 2.8 x 2.1 cm   Left Ovary appears normal measuring 1.9 x 1.5 x 1.9 cm and is best seen trans abdominally.   Other Findings:  No pelvic fluid is noted.   IMPRESSION: Fibroid uterus with fibroid sizes and locations as noted above. One fibroid appears to have a partial submucosal position.  The degree of involvement could be better evaluated with sono hysterography if desired. Visualized endometrial lining is thin corresponding with expected appearance for given phase of cycle.   Normal ovaries.  Provider: [facility]    Patient Active Problem List   Diagnosis Date Noted  . Dysmenorrhea 08/13/2017  . Fibroid uterus 08/13/2017       Plan  Michelle Becker scheduled She hopes to have TAH. She will have EMB next visit       Emeterio Reeve 08/13/2017, 11:03 AM

## 2017-08-13 NOTE — Patient Instructions (Signed)
Abdominal Hysterectomy °Abdominal hysterectomy is a surgical procedure to remove the womb (uterus). The uterus is the muscular organ that houses a developing baby. This surgery may be done if: °· You have cancer. °· You have growths (tumors or fibroids) in the uterus. °· You have long-term (chronic) pain. °· You are bleeding. °· Your uterus has slipped down into your vagina (uterine prolapse). °· You have a condition in which the tissue that lines the uterus grows outside of its normal location (endometriosis). °· You have an infection in your uterus. °· You are having problems with your menstrual cycle. ° °Depending on why you are having this procedure, you may also have other reproductive organs removed. These could include: °· The part of your vagina that connects with your uterus (cervix). °· The organs that make eggs (ovaries). °· The tubes that connect the ovaries to the uterus (fallopian tubes). ° °Tell a health care provider about: °· Any allergies you have. °· All medicines you are taking, including vitamins, herbs, eye drops, creams, and over-the-counter medicines. °· Any problems you or family members have had with anesthetic medicines. °· Any blood disorders you have. °· Any surgeries you have had. °· Any medical conditions you have. °· Whether you are pregnant or may be pregnant. °What are the risks? °Generally, this is a safe procedure. However, problems may occur, including: °· Bleeding. °· Infection. °· Allergic reactions to medicines or dyes. °· Damage to other structures or organs. °· Nerve injury. °· Decreased interest in sex or pain during sex. °· Blood clots that can break free and travel to your lungs. ° °What happens before the procedure? °Staying hydrated °Follow instructions from your health care provider about hydration, which may include: °· Up to 2 hours before the procedure - you may continue to drink clear liquids, such as water, clear fruit juice, black coffee, and plain tea ° °Eating  and drinking restrictions °Follow instructions from your health care provider about eating and drinking, which may include: °· 8 hours before the procedure - stop eating heavy meals or foods such as meat, fried foods, or fatty foods. °· 6 hours before the procedure - stop eating light meals or foods, such as toast or cereal. °· 6 hours before the procedure - stop drinking milk or drinks that contain milk. °· 2 hours before the procedure - stop drinking clear liquids. ° °Medicines °· Ask your health care provider about: °? Changing or stopping your regular medicines. This is especially important if you are taking diabetes medicines or blood thinners. °? Taking medicines such as aspirin and ibuprofen. These medicines can thin your blood. Do not take these medicines before your procedure if your health care provider instructs you not to. °· You may be given antibiotic medicine to help prevent infection. Take it as told by your health care provider. °· You may be asked to take laxatives to prevent constipation. °General instructions °· Ask your health care provider how your surgical site will be marked or identified. °· You may be asked to shower with a germ-killing soap. °· Plan to have someone take you home from the hospital. °· Do not use any products that contain nicotine or tobacco, such as cigarettes and e-cigarettes. If you need help quitting, ask your health care provider. °· You may have an exam or testing. °· You may have a blood or urine sample taken. °· You may need to have an enema to clean out your rectum and lower colon. °· This   procedure can affect the way you feel about yourself. Talk to your health care provider about the physical and emotional changes this procedure may cause. What happens during the procedure?  To lower your risk of infection: ? Your health care team will wash or sanitize their hands. ? Your skin will be washed with soap. ? Hair may be removed from the surgical area.  An IV  tube will be inserted into one of your veins.  You will be given one or more of the following: ? A medicine to help you relax (sedative). ? A medicine to make you fall asleep (general anesthetic).  Tight-fitting (compression) stockings will be placed on your legs to promote circulation.  A thin, flexible tube (catheter) will be inserted to help drain your urine.  The surgeon will make a cut (incision) through the skin in your lower belly. The incision may go side-to-side or up-and-down.  The surgeon will move aside the body tissue that covers your uterus. The surgeon will then carefully take out your uterus along with any of the other organs that need to be removed.  Bleeding will be controlled with clamps or sutures.  The surgeon will close your incision with stitches (sutures), skin glue, or adhesive strips.  A bandage (dressing) will be placed over the incision. The procedure may vary among health care providers and hospitals. What happens after the procedure?  You will be given pain medicine as needed.  Your blood pressure, heart rate, breathing rate, and blood oxygen level will be monitored until the medicines you were given have worn off.  You will need to stay in the hospital to recover for one to two days. Ask your health care provider how long you will need to stay in the hospital after your procedure.  You may have a liquid diet at first. You will most likely return to your usual diet the day after surgery.  You will still have the urinary catheter in place. It will likely be removed the day after surgery.  You may have to wear compression stockings. These stockings help to prevent blood clots and reduce swelling in your legs.  You will be encouraged to walk as soon as possible. You will also use a device or do breathing exercises to keep your lungs clear.  You may need to use a sanitary napkin for vaginal discharge. Summary  Abdominal hysterectomy is a surgical  procedure to remove the womb (uterus). The uterus is the muscular organ that houses a developing baby.  This procedure can affect the way you feel about yourself. Talk to your health care provider about the physical and emotional changes this procedure may cause.  You will be given medicines for pain after the procedure.  You will need to stay in the hospital to recover. Ask your health care provider how long you will need to stay in the hospital after your procedure. This information is not intended to replace advice given to you by your health care provider. Make sure you discuss any questions you have with your health care provider. Document Released: 12/20/2013 Document Revised: 12/03/2016 Document Reviewed: 12/03/2016 Elsevier Interactive Patient Education  2017 [employer] Uterine Fibroids Uterine fibroids are tissue masses (tumors) that can develop in the womb (uterus). They are also called leiomyomas. This type of tumor is not cancerous (benign) and does not spread to other parts of the body outside of the pelvic area, which is between the hip bones. Occasionally, fibroids may develop in the fallopian  tubes, in the cervix, or on the support structures (ligaments) that surround the uterus. You can have one or many fibroids. Fibroids can vary in size, weight, and where they grow in the uterus. Some can become quite large. Most fibroids do not require medical treatment. What are the causes? A fibroid can develop when a single uterine cell keeps growing (replicating). Most cells in the human body have a control mechanism that keeps them from replicating without control. What are the signs or symptoms? Symptoms may include:  Heavy bleeding during your period.  Bleeding or spotting between periods.  Pelvic pain and pressure.  Bladder problems, such as needing to urinate more often (urinary frequency) or urgently.  Inability to reproduce offspring (infertility).  Miscarriages.  How  is this diagnosed? Uterine fibroids are diagnosed through a physical exam. Your health care provider may feel the lumpy tumors during a pelvic exam. Ultrasonography and an MRI may be done to determine the size, location, and number of fibroids. How is this treated? Treatment may include:  Watchful waiting. This involves getting the fibroid checked by your health care provider to see if it grows or shrinks. Follow your health care provider's recommendations for how often to have this checked.  Hormone medicines. These can be taken by mouth or given through an intrauterine device (IUD).  Surgery. ? Removing the fibroids (myomectomy) or the uterus (hysterectomy). ? Removing blood supply to the fibroids (uterine artery embolization).  If fibroids interfere with your fertility and you want to become pregnant, your health care provider may recommend having the fibroids removed. Follow these instructions at home:  Keep all follow-up visits as directed by your health care provider. This is important.  Take over-the-counter and prescription medicines only as told by your health care provider. ? If you were prescribed a hormone treatment, take the hormone medicines exactly as directed.  Ask your health care provider about taking iron pills and increasing the amount of dark green, leafy vegetables in your diet. These actions can help to boost your blood iron levels, which may be affected by heavy menstrual bleeding.  Pay close attention to your period and tell your health care provider about any changes, such as: ? Increased blood flow that requires you to use more pads or tampons than usual per month. ? A change in the number of days that your period lasts per month. ? A change in symptoms that are associated with your period, such as abdominal cramping or back pain. Contact a health care provider if:  You have pelvic pain, back pain, or abdominal cramps that cannot be controlled with  medicines.  You have an increase in bleeding between and during periods.  You soak tampons or pads in a half hour or less.  You feel lightheaded, extra tired, or weak. Get help right away if:  You faint.  You have a sudden increase in pelvic pain. This information is not intended to replace advice given to you by your health care provider. Make sure you discuss any questions you have with your health care provider. Document Released: 12/12/2000 Document Revised: 08/14/2016 Document Reviewed: 06/13/2014 Elsevier Interactive Patient Education  [employer].

## 2017-08-14 ENCOUNTER — Ambulatory Visit
Admission: RE | Admit: 2017-08-14 | Discharge: 2017-08-14 | Disposition: A | Discharge status: Home / Self Care | Payer: Medicaid Other | Source: Ambulatory Visit | Attending: Obstetrics & Gynecology | Admitting: Obstetrics & Gynecology

## 2017-08-14 DIAGNOSIS — Z1231 Encounter for screening mammogram for malignant neoplasm of breast: Secondary | ICD-10-CM

## 2017-08-19 ENCOUNTER — Ambulatory Visit (HOSPITAL_COMMUNITY)
Admission: RE | Admit: 2017-08-19 | Discharge: 2017-08-19 | Disposition: A | Discharge status: Home / Self Care | Payer: Medicaid Other | Source: Ambulatory Visit | Attending: Obstetrics & Gynecology | Admitting: Obstetrics & Gynecology

## 2017-08-19 DIAGNOSIS — N859 Noninflammatory disorder of uterus, unspecified: Secondary | ICD-10-CM | POA: Diagnosis not present

## 2017-08-19 DIAGNOSIS — N946 Dysmenorrhea, unspecified: Secondary | ICD-10-CM | POA: Insufficient documentation

## 2017-08-19 DIAGNOSIS — D259 Leiomyoma of uterus, unspecified: Secondary | ICD-10-CM | POA: Diagnosis not present

## 2017-08-19 LAB — CYTOLOGY - PAP
DIAGNOSIS: NEGATIVE
HPV: NOT DETECTED

## 2017-09-03 ENCOUNTER — Telehealth: Payer: Self-pay | Admitting: Family Medicine

## 2017-09-03 NOTE — Telephone Encounter (Signed)
Patient calling to get test results.

## 2017-09-07 ENCOUNTER — Ambulatory Visit: Payer: Self-pay | Admitting: Obstetrics & Gynecology

## 2017-09-10 NOTE — Telephone Encounter (Signed)
Notified pt of normal pap smear and [place] results showed fibroids.  Pt stated understanding with no further questions.

## 2019-06-24 ENCOUNTER — Ambulatory Visit (INDEPENDENT_AMBULATORY_CARE_PROVIDER_SITE_OTHER): Payer: Self-pay

## 2019-06-24 ENCOUNTER — Encounter (HOSPITAL_COMMUNITY): Payer: Self-pay

## 2019-06-24 ENCOUNTER — Other Ambulatory Visit: Payer: Self-pay

## 2019-06-24 ENCOUNTER — Ambulatory Visit (HOSPITAL_COMMUNITY)
Admission: EM | Admit: 2019-06-24 | Discharge: 2019-06-24 | Disposition: A | Discharge status: Home / Self Care | Payer: Medicaid Other | Attending: Internal Medicine | Admitting: Internal Medicine

## 2019-06-24 DIAGNOSIS — M25561 Pain in right knee: Secondary | ICD-10-CM

## 2019-06-24 DIAGNOSIS — M545 Low back pain: Secondary | ICD-10-CM

## 2019-06-24 MED ORDER — MELOXICAM 7.5 MG PO TABS: 7.5000 mg | ORAL_TABLET | Freq: Every day | ORAL | 0 refills | Status: DC

## 2019-06-24 NOTE — ED Triage Notes (Signed)
Patient presents to Urgent Care with complaints of right knee pain since 3 weeks ago. Patient reports she does a lot of walking and standing at work and it has further irritated it, has been trying otc meds with no relief.

## 2019-06-24 NOTE — ED Provider Notes (Signed)
[place]    CSN: [id number] Arrival date & time: 06/24/19  2671     History   Chief Complaint Chief Complaint  Patient presents with  . Knee Pain    HPI Michelle Becker is a 48 y.o. female.   [name] presents with complaints of right knee pain for the past three weeks. Has been worse since starting a new job. 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 Pain is worse with activity. Not worse when she first wakes. She feels it does get swollen. States she feels like it causes her entire right side to have pain, including her hip and low back, as well as buttocks. Sometimes her toes tingle but no numbness or weakness. No saddle involvement. No loss of bladder or bowel. No specific injury to her knee. She feels it swells and has "fluid." no redness. No fevers. Denies any previous injury or surgeries to the knee or back. Doesn't follow with orthopedics. Has tried aleve and ibuprofen which haven't helped with pain. Hasn't taken today. Had to leave work today due to pain. Pain 7/10 in severity. It is generalized pain. Worse with flexion, extension and weight bearing. Without contributing medical history.      ROS per HPI, negative if not otherwise mentioned.      Past Medical History:  Diagnosis Date  . Asthma   . Fibroid     Patient Active Problem List   Diagnosis Date Noted  . Dysmenorrhea 08/13/2017  . Fibroid uterus 08/13/2017    Past Surgical History:  Procedure Laterality Date  . TUBAL LIGATION  2003    OB History    Gravida  4   Para  4   Term  4   Preterm  0   AB  0   Living  3     SAB  0   TAB  0   Ectopic  0   Multiple  0   Live Births  4            Home Medications    Prior to Admission medications   Medication Sig Start Date End Date Taking? Authorizing Provider  meloxicam (MOBIC) 7.5 MG tablet Take 1 tablet (7.5 mg total) by mouth daily. 06/24/19   Zigmund Gottron, NP    Family  History Family History  Problem Relation Age of Onset  . Healthy Mother   . Cancer Father        lung  . Crohn's disease Father     Social History Social History   Tobacco Use  . Smoking status: Current Every Day Smoker    Packs/day: 0.25    Years: 10.00    Pack years: 2.50    Types: Cigarettes  . Smokeless tobacco: Never Used  Substance Use Topics  . Alcohol use: No  . Drug use: Yes    Types: Marijuana    Comment: monthly     Allergies   Amoxicillin   Review of Systems Review of Systems   Physical Exam Triage Vital Signs ED Triage Vitals  Enc Vitals Group     BP 06/24/19 1001 123/76     Pulse Rate 06/24/19 1001 75     Resp 06/24/19 1001 17     Temp 06/24/19 1001 97.9 F (36.6 C)     Temp Source 06/24/19 1001 Oral     SpO2 06/24/19 1001 100 %     Weight --  Height --      Head Circumference --      Peak Flow --      Pain Score 06/24/19 0959 9     Pain Loc --      Pain Edu? --      Excl. in [place]? --    No data found.  Updated Vital Signs BP 123/76 (BP Location: Right Arm)   Pulse 75   Temp 97.9 F (36.6 C) (Oral)   Resp 17   LMP 05/27/2019 Comment: possible premenopausal  SpO2 100%   Visual Acuity Right Eye Distance:   Left Eye Distance:   Bilateral Distance:    Right Eye Near:   Left Eye Near:    Bilateral Near:     Physical Exam Constitutional:      General: She is not in acute distress.    Appearance: She is well-developed.  Cardiovascular:     Rate and Rhythm: Normal rate.  Pulmonary:     Effort: Pulmonary effort is normal.  Musculoskeletal:     Right knee: She exhibits decreased range of motion, effusion and bony tenderness. She exhibits no swelling, no ecchymosis, no laceration, no erythema, normal alignment, no LCL laxity, normal patellar mobility, normal meniscus and no MCL laxity. Tenderness found.     Comments: Generalized right knee pain on palpation, bony and soft tissue tenderness; no redness or warmth. Mild effusion  noted; pain with flexion and mild pain with extension; no obvious laxity; gross sensation intact   Skin:    General: Skin is warm and dry.  Neurological:     Mental Status: She is alert and oriented to person, place, and time.      UC Treatments / Results  Labs (all labs ordered are listed, but only abnormal results are displayed) Labs Reviewed - No data to display  EKG None  Radiology Dg Knee Complete 4 Views Right  Result Date: 06/24/2019 CLINICAL DATA:  Right knee pain for 3 weeks.  No known injury. EXAM: RIGHT KNEE - COMPLETE 4+ VIEW COMPARISON:  None. FINDINGS: No evidence of fracture, dislocation, or joint effusion. Very small osteochondroma off the lateral metaphysis of the tibia is noted. No evidence of arthropathy or other focal bone abnormality. Soft tissues are unremarkable. IMPRESSION: Negative for arthropathy or acute abnormality. Small osteochondroma lateral tibial metaphysis. Electronically Signed   By: Inge Rise M.D.   On: 06/24/2019 11:05    Procedures Procedures (including critical care time)  Medications Ordered in UC Medications - No data to display  Initial Impression / Assessment and Plan / UC Course  I have reviewed the triage vital signs and the nursing notes.  Pertinent labs & imaging results that were available during my care of the patient were reviewed by me and considered in my medical decision making (see chart for details).     Discussed back, knee, hip relationship and physical working. Discussed stretching, core strengthening, correct lifting techniques and pain management. mobic daily provided. Encouraged follow up as needed. Patient verbalized understanding and agreeable to plan.   Final Clinical Impressions(s) / UC Diagnoses   Final diagnoses:  Acute pain of right knee     Discharge Instructions     Xray is reassuring here today.  Will start a daily antiinflammatory to try to better manage your pain.  Knee sleeve with  activity.  It is difficult to tell if back pain is causing knee pain or knee pain causing back pain.  Please follow up with orthopedics for  persistent symptoms.  Light and regular activity, stretch regularly both your back and legs/knees. Ice application, especially after working.     ED Prescriptions    Medication Sig Dispense Auth. Provider   meloxicam (MOBIC) 7.5 MG tablet Take 1 tablet (7.5 mg total) by mouth daily. 30 tablet Zigmund Gottron, NP     Controlled Substance Prescriptions [place] Controlled Substance Registry consulted? Not Applicable   Zigmund Gottron, NP 06/24/19 1215

## 2019-06-24 NOTE — Discharge Instructions (Signed)
Xray is reassuring here today.  Will start a daily antiinflammatory to try to better manage your pain.  Knee sleeve with activity.  It is difficult to tell if back pain is causing knee pain or knee pain causing back pain.  Please follow up with orthopedics for persistent symptoms.  Light and regular activity, stretch regularly both your back and legs/knees. Ice application, especially after working.

## 2019-07-04 ENCOUNTER — Ambulatory Visit (INDEPENDENT_AMBULATORY_CARE_PROVIDER_SITE_OTHER): Payer: Self-pay | Admitting: Family Medicine

## 2019-07-04 ENCOUNTER — Other Ambulatory Visit: Payer: Self-pay

## 2019-07-04 VITALS — BP 110/64 | Ht 63.0 in | Wt 140.0 lb

## 2019-07-04 DIAGNOSIS — M25561 Pain in right knee: Secondary | ICD-10-CM

## 2019-07-04 NOTE — Patient Instructions (Signed)
You're doing great! Continue wearing the brace when up and walking around especially at work. Take meloxicam daily with food for 7-10 days typically then as needed. Icing 15 minutes at a time as needed. Elevate above your heart when needed for swelling. Follow up with me as needed.

## 2019-07-06 ENCOUNTER — Encounter: Payer: Self-pay | Admitting: Family Medicine

## 2019-07-06 NOTE — Progress Notes (Signed)
PCP: Patient, No Pcp Per  Subjective:   HPI: Patient is a 48 y.o. female here for right knee pain.  Patient reports that at her current job she has been more physically active there over the past few weeks. 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 She denies any acute injury or trauma. Pain in the knee has been like a toothache anterior knee though difficulty localizing specifically. Had some pain going down into the ankle and foot and shooting up to the buttocks. Did have some swelling but this has improved. Has been wearing a knee brace, icing, taking meloxicam which is helped her quite a bit. She continued to work through this. No skin changes or numbness.  Past Medical History:  Diagnosis Date  . Asthma   . Fibroid     Current Outpatient Medications on File Prior to Visit  Medication Sig Dispense Refill  . meloxicam (MOBIC) 7.5 MG tablet Take 1 tablet (7.5 mg total) by mouth daily. 30 tablet 0   No current facility-administered medications on file prior to visit.     Past Surgical History:  Procedure Laterality Date  . TUBAL LIGATION  2003    Allergies  Allergen Reactions  . Amoxicillin Itching    Throat itching and scratching    Social History   Socioeconomic History  . Marital status: Single    Spouse name: Not on file  . Number of children: Not on file  . Years of education: Not on file  . Highest education level: Not on file  Occupational History  . Not on file  Social Needs  . Financial resource strain: Not on file  . Food insecurity    Worry: Not on file    Inability: Not on file  . Transportation needs    Medical: Not on file    Non-medical: Not on file  Tobacco Use  . Smoking status: Current Every Day Smoker    Packs/day: 0.25    Years: 10.00    Pack years: 2.50    Types: Cigarettes  . Smokeless tobacco: Never Used  Substance and Sexual Activity  . Alcohol use: No  . Drug use: Yes    Types: Marijuana    Comment: monthly  .  Sexual activity: Yes    Birth control/protection: Surgical  Lifestyle  . Physical activity    Days per week: Not on file    Minutes per session: Not on file  . Stress: Not on file  Relationships  . Social Herbalist on phone: Not on file    Gets together: Not on file    Attends religious service: Not on file    Active member of club or organization: Not on file    Attends meetings of clubs or organizations: Not on file    Relationship status: Not on file  . Intimate partner violence    Fear of current or ex partner: Not on file    Emotionally abused: Not on file    Physically abused: Not on file    Forced sexual activity: Not on file  Other Topics Concern  . Not on file  Social History Narrative  . Not on file    Family History  Problem Relation Age of Onset  . Healthy Mother   . Cancer Father        lung  . Crohn's disease Father     BP 110/64   Ht 5\' 3"  (1.6 m)   Wt 140  lb (63.5 kg)   BMI 24.80 kg/m   Review of Systems: See HPI above.     Objective:  Physical Exam:  Gen: NAD, comfortable in exam room  Right knee: No gross deformity, ecchymoses, effusion. No TTP. FROM with 5/5 strength flexion and extension. Negative ant/post drawers. Negative valgus/varus testing. Negative lachmans. Negative mcmurrays, apleys, patellar apprehension. NV intact distally.  Left knee: No deformity. FROM with 5/5 strength. No tenderness to palpation. NVI distally.   Assessment & Plan:  1.  Right knee pain:  Patient is doing much better with knee brace, meloxicam, icing since visit to ED.  Continue with these measures.  Elevate as needed for swelling.  F/u prn.

## 2019-07-29 ENCOUNTER — Other Ambulatory Visit: Payer: Self-pay

## 2019-07-29 ENCOUNTER — Encounter (HOSPITAL_COMMUNITY): Payer: Self-pay | Admitting: Emergency Medicine

## 2019-07-29 ENCOUNTER — Ambulatory Visit (HOSPITAL_COMMUNITY)
Admission: EM | Admit: 2019-07-29 | Discharge: 2019-07-29 | Disposition: A | Discharge status: Home / Self Care | Payer: Medicaid Other | Attending: Urgent Care | Admitting: Urgent Care

## 2019-07-29 DIAGNOSIS — Z566 Other physical and mental strain related to work: Secondary | ICD-10-CM

## 2019-07-29 DIAGNOSIS — G44209 Tension-type headache, unspecified, not intractable: Secondary | ICD-10-CM

## 2019-07-29 MED ORDER — NAPROXEN 500 MG PO TABS: 500.0000 mg | ORAL_TABLET | Freq: Two times a day (BID) | ORAL | 0 refills | Status: AC

## 2019-07-29 MED ORDER — CYCLOBENZAPRINE HCL 5 MG PO TABS: 5.0000 mg | ORAL_TABLET | Freq: Every evening | ORAL | 0 refills | Status: AC | PRN

## 2019-07-29 NOTE — ED Provider Notes (Signed)
MRN: [id number] DOB: 1971-09-05  Subjective:   Michelle Becker is a 48 y.o. female presenting for consult on her blood pressure. Patient noticed symptoms of white flashes in her vision, developed mild-moderate headache, felt like she was going to pass out. They were unable to check her BP while at work and was advised to go home. Has been checking her BP since yesterday, had readings between 120-150s. Last check was in 130s this morning. Has had persistent frontal headache, behind her left eye, is a pounding sensation, moderate-severe. Admits that it has been bothering her for the past 2 weeks intermittently, has been worsening. Has tried Aleve for her headaches. She is trying to quit smoking, is down to 2 cigarettes per day. Has been working on this for the past 4 months. Denies any alcohol use. Admits a lot of stress with her work, works in [employer], is a Production designer, theatre/television/film. Has had exposure to COVID through co-workers. They have had 2 positive cases and believes her employer is lying about cleaning the building. Sleeps ~4 hours/night. She is having a difficult time with worrying.    Denies taking any chronic medications.     Allergies  Allergen Reactions  . Amoxicillin Itching    Throat itching and scratching    Past Medical History:  Diagnosis Date  . Asthma   . Fibroid      Past Surgical History:  Procedure Laterality Date  . TUBAL LIGATION  2003    ROS  Objective:   Vitals: BP 140/78 (BP Location: Left Arm)   Pulse 65   Temp 98.1 F (36.7 C) (Oral)   Resp 18   SpO2 100%   Physical Exam Constitutional:      General: She is not in acute distress.    Appearance: Normal appearance. She is well-developed. She is not ill-appearing, toxic-appearing or diaphoretic.  HENT:     Head: Normocephalic and atraumatic.     Nose: Nose normal.     Mouth/Throat:     Mouth: Mucous membranes are moist.     Pharynx: Oropharynx is clear.  Eyes:     General: No scleral icterus.  Extraocular Movements: Extraocular movements intact.     Pupils: Pupils are equal, round, and reactive to light.  Cardiovascular:     Rate and Rhythm: Normal rate and regular rhythm.     Pulses: Normal pulses.     Heart sounds: Normal heart sounds. No murmur. No friction rub. No gallop.   Pulmonary:     Effort: Pulmonary effort is normal. No respiratory distress.     Breath sounds: Normal breath sounds. No stridor. No wheezing, rhonchi or rales.  Skin:    General: Skin is warm and dry.     Findings: No rash.  Neurological:     General: No focal deficit present.     Mental Status: She is alert and oriented to person, place, and time.     Cranial Nerves: No cranial nerve deficit.     Motor: No weakness.     Coordination: Coordination normal.     Gait: Gait normal.     Deep Tendon Reflexes: Reflexes normal.  Psychiatric:        Mood and Affect: Mood normal.        Behavior: Behavior normal.        Thought Content: Thought content normal.        Judgment: Judgment normal.      Assessment and Plan :   1. Acute non  intractable tension-type headache   2. Stress at work     I suspect the patient has significant stress from her work and becoming ill with COVID-19, passing it along to her family at home.  Recommended patient get some rest at home and try to practice TLC and self-care.  Encourage patient to consider finding another job that will not stress her as much.  In the meantime will use Flexeril as a muscle relaxant for tension of her upper back, sleep.  Use naproxen for headaches. Counseled patient on potential for adverse effects with medications prescribed/recommended today, ER and return-to-clinic precautions discussed, patient verbalized understanding.    Jaynee Eagles, PA-C 07/29/19 1422

## 2019-07-29 NOTE — ED Triage Notes (Signed)
Pt presents to [facility] for assessment of hypertension yesterday.  Patient sent home from work yesterday due to seeing a "white flash", and headache.    153/102 (upon getting home) 129/82 (730pm) 120/74 (330am) 140/83 (10am this morning)

## 2019-07-29 NOTE — Discharge Instructions (Signed)

## 2019-08-31 IMAGING — DX RIGHT KNEE - COMPLETE 4+ VIEW
5 series · 5 of 5 positions shown · non-contrast
Comparison: None.

CLINICAL DATA: Right knee pain for 3 weeks.  No known injury.

EXAM:
RIGHT KNEE - COMPLETE 4+ VIEW

[knee ap]
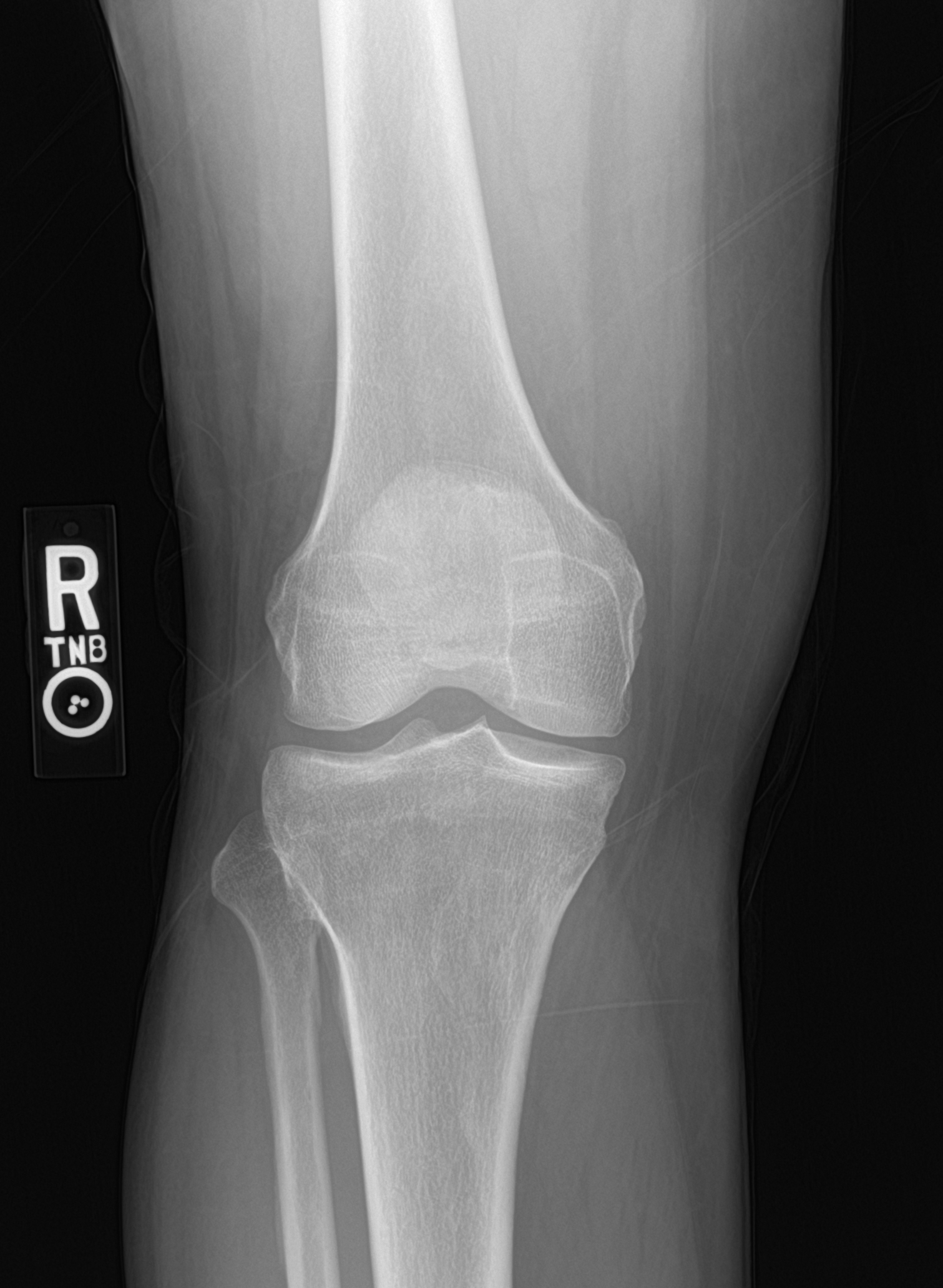

[knee obl (1 of 2)]
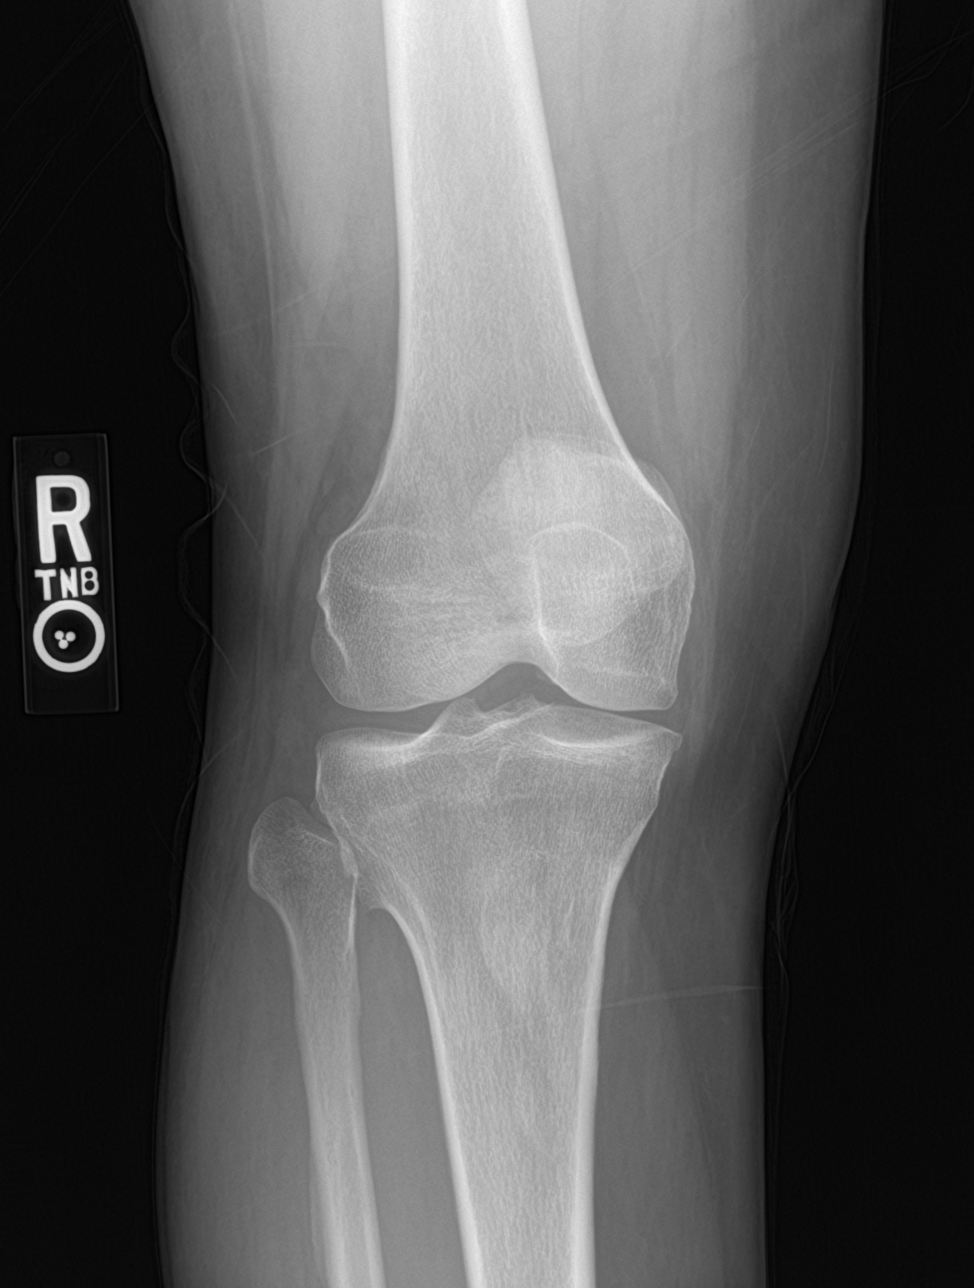

[knee obl (2 of 2)]
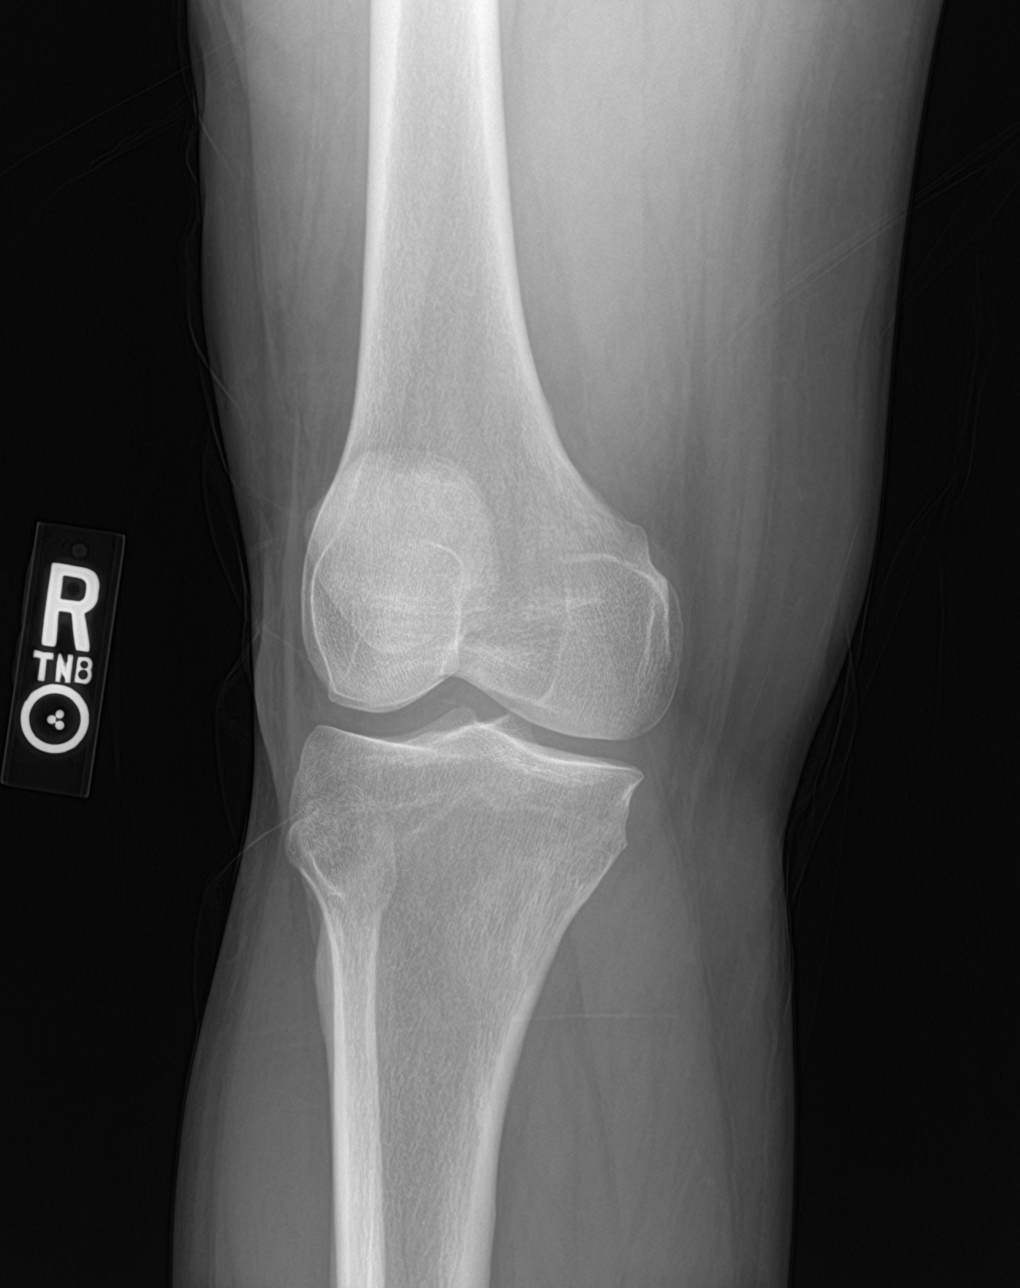

[knee lat]
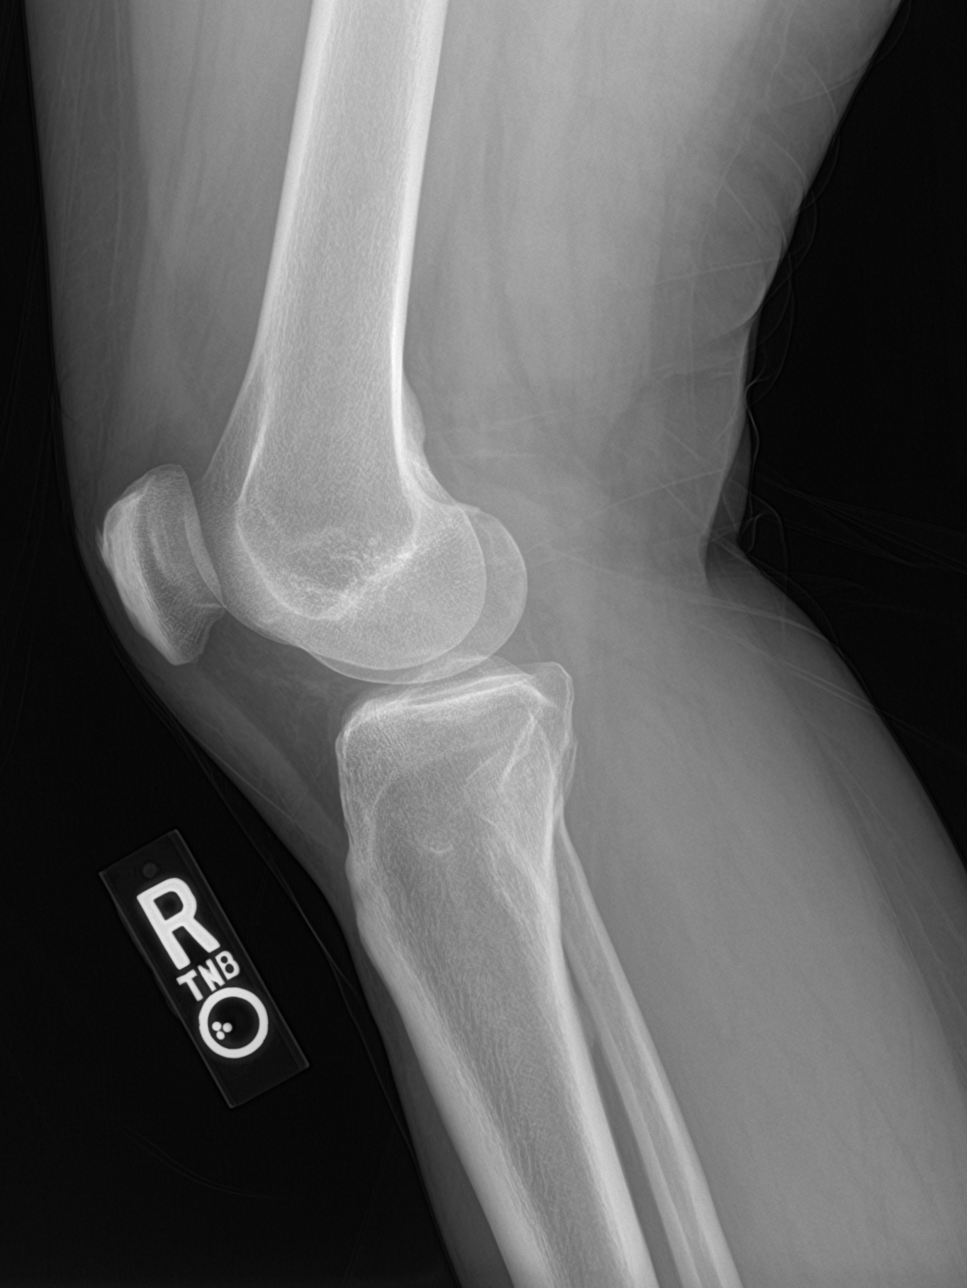

[knee sunrise]
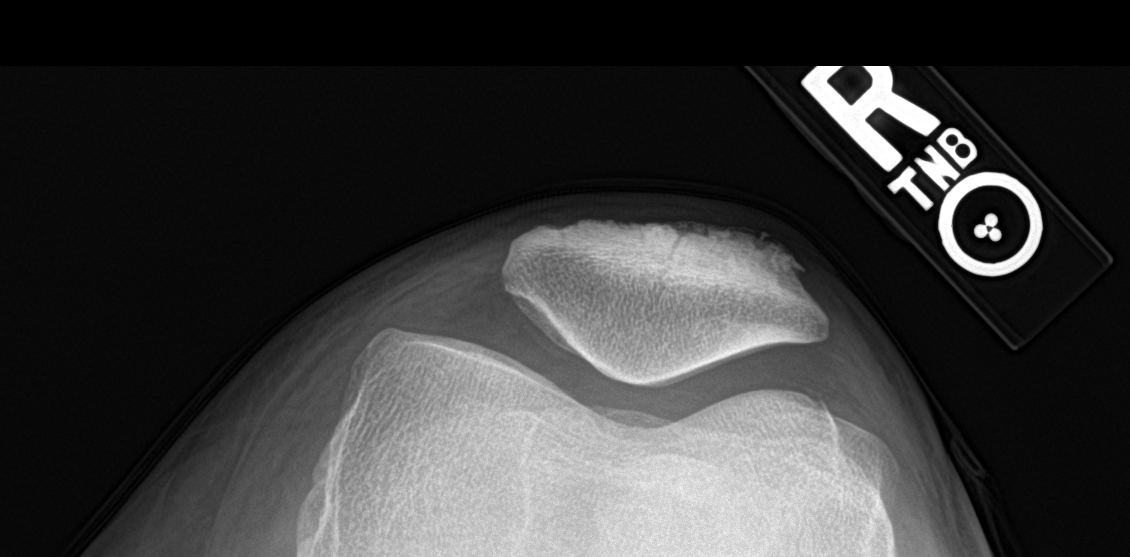

[5 of 5 positions shown; findings below may reference images not displayed]

FINDINGS: No evidence of fracture, dislocation, or joint effusion. Very small
osteochondroma off the lateral metaphysis of the tibia is noted. No
evidence of arthropathy or other focal bone abnormality. Soft
tissues are unremarkable.
IMPRESSION: Negative for arthropathy or acute abnormality.

Small osteochondroma lateral tibial metaphysis.

## 2020-02-15 ENCOUNTER — Other Ambulatory Visit: Payer: Self-pay

## 2020-02-15 ENCOUNTER — Encounter (HOSPITAL_COMMUNITY): Payer: Self-pay

## 2020-02-15 ENCOUNTER — Ambulatory Visit (HOSPITAL_COMMUNITY)
Admission: EM | Admit: 2020-02-15 | Discharge: 2020-02-15 | Disposition: A | Discharge status: Home / Self Care | Payer: Self-pay

## 2020-02-15 DIAGNOSIS — Z3202 Encounter for pregnancy test, result negative: Secondary | ICD-10-CM

## 2020-02-15 DIAGNOSIS — R109 Unspecified abdominal pain: Secondary | ICD-10-CM

## 2020-02-15 DIAGNOSIS — N939 Abnormal uterine and vaginal bleeding, unspecified: Secondary | ICD-10-CM

## 2020-02-15 LAB — POCT URINALYSIS DIP (DEVICE)
Bilirubin Urine: NEGATIVE
Glucose, UA: NEGATIVE mg/dL
Ketones, ur: NEGATIVE mg/dL
Leukocytes,Ua: NEGATIVE
Nitrite: NEGATIVE
Protein, ur: NEGATIVE mg/dL
Specific Gravity, Urine: >=1.03 (ref 1.005–1.030)
Urobilinogen, UA: 0.2 mg/dL (ref 0.0–1.0)
pH: 6 (ref 5.0–8.0)

## 2020-02-15 LAB — POCT PREGNANCY, URINE: Preg Test, Ur: NEGATIVE

## 2020-02-15 LAB — POC URINE PREG, ED: Preg Test, Ur: NEGATIVE

## 2020-02-15 MED ORDER — KETOROLAC TROMETHAMINE 60 MG/2ML IM SOLN
60.0000 mg | Freq: Once | INTRAMUSCULAR | Status: AC
  Administered 2020-02-15: 16:00:00 60 mg via INTRAMUSCULAR

## 2020-02-15 MED ORDER — TRAMADOL HCL 50 MG PO TABS: 50.0000 mg | ORAL_TABLET | Freq: Four times a day (QID) | ORAL | 0 refills | Status: AC | PRN

## 2020-02-15 MED ORDER — KETOROLAC TROMETHAMINE 60 MG/2ML IM SOLN
INTRAMUSCULAR | Status: AC
  Filled 2020-02-15: qty 2

## 2020-02-15 NOTE — Discharge Instructions (Addendum)
We have given you an injection of Toradol for pain and sent in Tramadol. Take as prescribed.  Follow up with gynecology.  If bleeding increases or pain becomes unrelenting, report to the Emergency Department.

## 2020-02-15 NOTE — ED Provider Notes (Signed)
[place]    CSN: [id number] Arrival date & time: 02/15/20  1523      History   Chief Complaint Chief Complaint  Patient presents with  . Abdominal Cramping  . Chills  . Dizziness    HPI Michelle Becker is a 49 y.o. female.   Reports very intense cramping with her period this cycle. Has been occurring x 2 days. Has taken ibuprofen and tylenol with little relief. Reports increased vaginal bleeding with increased cramps. Denies passing blood clots. Denies fever, HA, chills, SOB, ST, rash or other symptoms. Reports that she has known fibroids but does not see GYN.   ROS per HPI  The history is provided by the patient.  Abdominal Cramping  Dizziness   Past Medical History:  Diagnosis Date  . Asthma   . Fibroid     Patient Active Problem List   Diagnosis Date Noted  . Dysmenorrhea 08/13/2017  . Fibroid uterus 08/13/2017    Past Surgical History:  Procedure Laterality Date  . TUBAL LIGATION  2003    OB History    Gravida  4   Para  4   Term  4   Preterm  0   AB  0   Living  3     SAB  0   TAB  0   Ectopic  0   Multiple  0   Live Births  4            Home Medications    Prior to Admission medications   Medication Sig Start Date End Date Taking? Authorizing Provider  ibuprofen (ADVIL) 200 MG tablet Take 200 mg by mouth every 6 (six) hours as needed.   Yes [provider]  cyclobenzaprine (FLEXERIL) 5 MG tablet Take 1 tablet (5 mg total) by mouth at bedtime as needed for muscle spasms. 07/29/19   Jaynee Eagles, PA-C  naproxen (NAPROSYN) 500 MG tablet Take 1 tablet (500 mg total) by mouth 2 (two) times daily. 07/29/19   Jaynee Eagles, PA-C  traMADol (ULTRAM) 50 MG tablet Take 1 tablet (50 mg total) by mouth every 6 (six) hours as needed. 02/15/20   Faustino Congress, NP    Family History Family History  Problem Relation Age of Onset  . Healthy Mother   . Cancer Father        lung  . Crohn's disease Father      Social History Social History   Tobacco Use  . Smoking status: Current Every Day Smoker    Packs/day: 0.10    Years: 10.00    Pack years: 1.00    Types: Cigarettes  . Smokeless tobacco: Never Used  Substance Use Topics  . Alcohol use: No  . Drug use: Yes    Types: Marijuana    Comment: monthly     Allergies   Amoxicillin   Review of Systems Review of Systems  Neurological: Positive for dizziness.     Physical Exam Triage Vital Signs ED Triage Vitals  Enc Vitals Group     BP 02/15/20 1556 117/75     Pulse Rate 02/15/20 1556 68     Resp 02/15/20 1556 20     Temp 02/15/20 1556 97.6 F (36.4 C)     Temp Source 02/15/20 1556 Oral     SpO2 02/15/20 1556 100 %     Weight --      Height --      Head Circumference --  Peak Flow --      Pain Score 02/15/20 1555 10     Pain Loc --      Pain Edu? --      Excl. in [place]? --    No data found.  Updated Vital Signs BP 117/75 (BP Location: Left Arm)   Pulse 68   Temp 97.6 F (36.4 C) (Oral)   Resp 20   LMP 02/12/2020 (Exact Date)   SpO2 100%      Physical Exam Vitals and nursing note reviewed.  Constitutional:      General: She is not in acute distress.    Appearance: She is well-developed.  HENT:     Head: Normocephalic and atraumatic.     Nose: Nose normal.  Eyes:     Conjunctiva/sclera: Conjunctivae normal.  Cardiovascular:     Rate and Rhythm: Normal rate and regular rhythm.     Heart sounds: No murmur.  Pulmonary:     Effort: Pulmonary effort is normal. No respiratory distress.     Breath sounds: Normal breath sounds. No stridor. No wheezing, rhonchi or rales.  Chest:     Chest wall: No tenderness.  Abdominal:     General: Bowel sounds are normal.     Palpations: Abdomen is soft.     Tenderness: There is no abdominal tenderness.  Musculoskeletal:        General: Normal range of motion.     Cervical back: Neck supple.  Skin:    General: Skin is warm and dry.  Neurological:     General: No  focal deficit present.     Mental Status: She is alert and oriented to person, place, and time.  Psychiatric:        Mood and Affect: Mood normal.        Behavior: Behavior normal.      UC Treatments / Results  Labs (all labs ordered are listed, but only abnormal results are displayed) Labs Reviewed  POCT URINALYSIS DIP (DEVICE) - Abnormal; Notable for the following components:      Result Value   Hgb urine dipstick MODERATE (*)    All other components within normal limits  POCT PREGNANCY, URINE  POC URINE PREG, ED    EKG   Radiology No results found.  Procedures Procedures (including critical care time)  Medications Ordered in UC Medications  ketorolac (TORADOL) injection 60 mg (60 mg Intramuscular Given 02/15/20 1623)    Initial Impression / Assessment and Plan / UC Course  I have reviewed the triage vital signs and the nursing notes.  Pertinent labs & imaging results that were available during my care of the patient were reviewed by me and considered in my medical decision making (see chart for details).     Increased pain with menstrual cramping and increased vaginal bleeding. Toradol 60mg  IM in office today. Tramadol 50 mg #20 tabs printed for patient today. Instructed to follow up with GYN. Instructed on when to go the ED. Final Clinical Impressions(s) / UC Diagnoses   Final diagnoses:  Abdominal cramping  Abnormal vaginal bleeding     Discharge Instructions     We have given you an injection of Toradol for pain and sent in Tramadol. Take as prescribed.  Follow up with gynecology.  If bleeding increases or pain becomes unrelenting, report to the Emergency Department.     ED Prescriptions    Medication Sig Dispense Auth. Provider   traMADol (ULTRAM) 50 MG tablet Take 1 tablet (50 mg  total) by mouth every 6 (six) hours as needed. 15 tablet Faustino Congress, NP     I have reviewed the PDMP during this encounter.   Faustino Congress, NP  02/15/20 1631

## 2020-02-15 NOTE — ED Triage Notes (Signed)
Pt reports having abdominal cramps, chills and lightheaded since last night. Pt reports she is on her period and feel stabbing cramps since last night. Pt reports is painful when she walks.
